# Patient Record
Sex: Female | Born: 1935
Health system: Southern US, Community
[De-identification: ages and names within clinical notes are randomized; demographics above are authoritative.]

## PROBLEM LIST (undated history)

## (undated) DIAGNOSIS — I10 Essential (primary) hypertension: Secondary | ICD-10-CM

## (undated) DIAGNOSIS — E785 Hyperlipidemia, unspecified: Secondary | ICD-10-CM

## (undated) HISTORY — DX: Essential (primary) hypertension: I10

## (undated) HISTORY — DX: Hyperlipidemia, unspecified: E78.5

---

## 2001-11-16 ENCOUNTER — Other Ambulatory Visit: Admission: RE | Admit: 2001-11-16 | Discharge: 2001-11-16 | Payer: Self-pay | Admitting: Family Medicine

## 2001-11-29 ENCOUNTER — Encounter: Payer: Self-pay | Admitting: Family Medicine

## 2001-11-29 ENCOUNTER — Encounter: Admission: RE | Admit: 2001-11-29 | Discharge: 2001-11-29 | Payer: Self-pay | Admitting: Family Medicine

## 2002-03-26 ENCOUNTER — Ambulatory Visit (HOSPITAL_COMMUNITY): Admission: RE | Admit: 2002-03-26 | Discharge: 2002-03-26 | Payer: Self-pay | Admitting: Gastroenterology

## 2002-03-26 ENCOUNTER — Encounter (INDEPENDENT_AMBULATORY_CARE_PROVIDER_SITE_OTHER): Payer: Self-pay | Admitting: *Deleted

## 2003-04-22 ENCOUNTER — Ambulatory Visit (HOSPITAL_COMMUNITY): Admission: RE | Admit: 2003-04-22 | Discharge: 2003-04-22 | Payer: Self-pay | Admitting: Gastroenterology

## 2003-04-22 ENCOUNTER — Encounter (INDEPENDENT_AMBULATORY_CARE_PROVIDER_SITE_OTHER): Payer: Self-pay | Admitting: *Deleted

## 2007-05-09 ENCOUNTER — Encounter: Admission: RE | Admit: 2007-05-09 | Discharge: 2007-05-09 | Payer: Self-pay | Admitting: Family Medicine

## 2007-05-11 ENCOUNTER — Encounter: Admission: RE | Admit: 2007-05-11 | Discharge: 2007-05-11 | Payer: Self-pay | Admitting: Family Medicine

## 2007-05-11 ENCOUNTER — Encounter: Payer: Self-pay | Admitting: Pulmonary Disease

## 2008-02-05 ENCOUNTER — Encounter: Payer: Self-pay | Admitting: Pulmonary Disease

## 2008-02-25 ENCOUNTER — Ambulatory Visit: Payer: Self-pay | Admitting: Pulmonary Disease

## 2008-02-25 DIAGNOSIS — R0602 Shortness of breath: Secondary | ICD-10-CM | POA: Insufficient documentation

## 2008-02-25 DIAGNOSIS — I10 Essential (primary) hypertension: Secondary | ICD-10-CM | POA: Insufficient documentation

## 2008-02-25 DIAGNOSIS — E785 Hyperlipidemia, unspecified: Secondary | ICD-10-CM | POA: Insufficient documentation

## 2008-02-25 DIAGNOSIS — J309 Allergic rhinitis, unspecified: Secondary | ICD-10-CM | POA: Insufficient documentation

## 2008-03-13 ENCOUNTER — Ambulatory Visit: Payer: Self-pay | Admitting: Pulmonary Disease

## 2008-03-18 ENCOUNTER — Telehealth (INDEPENDENT_AMBULATORY_CARE_PROVIDER_SITE_OTHER): Payer: Self-pay | Admitting: *Deleted

## 2008-03-21 ENCOUNTER — Ambulatory Visit: Payer: Self-pay | Admitting: Pulmonary Disease

## 2008-09-12 HISTORY — PX: LUMBAR DISC SURGERY: SHX700

## 2008-12-17 ENCOUNTER — Encounter: Admission: RE | Admit: 2008-12-17 | Discharge: 2008-12-17 | Payer: Self-pay | Admitting: Family Medicine

## 2008-12-23 ENCOUNTER — Ambulatory Visit (HOSPITAL_COMMUNITY): Admission: RE | Admit: 2008-12-23 | Discharge: 2008-12-23 | Payer: Self-pay | Admitting: Neurosurgery

## 2010-01-13 ENCOUNTER — Encounter: Admission: RE | Admit: 2010-01-13 | Discharge: 2010-01-13 | Payer: Self-pay | Admitting: Family Medicine

## 2010-10-21 ENCOUNTER — Encounter (INDEPENDENT_AMBULATORY_CARE_PROVIDER_SITE_OTHER): Payer: Medicare Other | Admitting: Internal Medicine

## 2010-10-21 DIAGNOSIS — E785 Hyperlipidemia, unspecified: Secondary | ICD-10-CM

## 2010-10-21 DIAGNOSIS — I10 Essential (primary) hypertension: Secondary | ICD-10-CM

## 2010-12-22 LAB — CBC
HCT: 44.2 % (ref 36.0–46.0)
Hemoglobin: 15.4 g/dL — ABNORMAL HIGH (ref 12.0–15.0)
MCV: 89.9 fL (ref 78.0–100.0)
Platelets: 341 10*3/uL (ref 150–400)
RDW: 13.2 % (ref 11.5–15.5)

## 2010-12-22 LAB — BASIC METABOLIC PANEL
BUN: 18 mg/dL (ref 6–23)
CO2: 30 mEq/L (ref 19–32)
Chloride: 94 mEq/L — ABNORMAL LOW (ref 96–112)
Glucose, Bld: 81 mg/dL (ref 70–99)
Potassium: 3.9 mEq/L (ref 3.5–5.1)
Sodium: 132 mEq/L — ABNORMAL LOW (ref 135–145)

## 2011-01-25 NOTE — Op Note (Signed)
Veronica Mcgrath, Veronica Mcgrath             ACCOUNT NO.:  [id number]   MEDICAL RECORD NO.:  [id number]          PATIENT TYPE:  OIB   LOCATION:  3538                         FACILITY:  [facility]   PHYSICIAN:  Reinaldo Meeker, M.D. DATE OF BIRTH:  12-13-1934   DATE OF PROCEDURE:  12/23/2008  DATE OF DISCHARGE:  12/23/2008                               OPERATIVE REPORT   PREOPERATIVE DIAGNOSIS:  Herniated disk at L4-5, left.   POSTOPERATIVE DIAGNOSIS:  Herniated disk at L4-5, left.   PROCEDURE:  Left L4-5 interlaminar laminotomy for excision of herniated  disk with operating microscope.   SECONDARY PROCEDURE:  Microdissection at L4-5 disk and L5 nerve root.   SURGEON:  Reinaldo Meeker, MD   ASSISTANT:  Tia Alert, MD   PROCEDURE IN DETAIL:  After being placed in the prone position, the  patient's back was prepped and draped in the usual sterile fashion.  Localizing x-ray was taken prior to incision to identify the appropriate  level.  Midline incision was made above the spinous processes of L4 and  L5.  Using Bovie cutting current, the incision was carried down the  spinous processes.  Subperiosteal dissection was then carried out on the  left sided of the spinous processes and lamina.  Self-retaining  retractor was placed for exposure.  X-ray showed approach to the  appropriate level.  Using a high-speed drill, the inferior one third of  the L4 lamina, the inferior one third of the central, and the superior  one half of the L5 lamina were removed.  Residual bone and ligamentum  flavum were removed in a piecemeal fashion.  The microscope was draped,  brought in the field, used remainder of the case.  Using microdissection  technique, the lateral aspect of the thecal sac and L5 nerve were  identified.  Further coagulation was carried out down on the further  canal to identify the L4-5 disk, which was found remarkably herniated.  After coagulating on the annulas, fascia was incised with a  15-blade.  Using pituitary rongeurs and curettes, the disk space clean-out was  carried out.  At the same time, great care was taken avoid injury to the  neural elements.  This was successfully done.  Large free fragment disk  material was removed beneath the nerve and it was gave excellent  decompression of the thecal sac and L5 nerve root.  At this time,  inspection was carried out in all directions without any evidence of  residual compression and none could be identified.  Large amounts of  irrigation was carried out.  Any bleeding controlled with bipolar  coagulation and Gelfoam.  The wound was then closed in multiple layers  of Vicryl and the muscle.  Fascia, subcutaneous and subcuticular tissues  and staples were placed on the skin.  Sterile dressing was then applied  and the patient was extubated, taken to the recovery room in stable  condition.           ______________________________  Reinaldo Meeker, M.D.     ROK/MEDQ  D:  12/23/2008  T:  12/23/2008  Job:  [id number] 

## 2011-01-28 NOTE — Procedures (Signed)
[facility]  Patient:    Veronica Mcgrath, Veronica Mcgrath Visit Number: [id number] MRN: [id number]          Service Type: END Location: ENDO Attending Physician:  Nelda Marseille Dictated by:   Petra Kuba, M.D. Proc. Date: 03/26/02 Admit Date:  03/26/2002 Discharge Date: 03/26/2002   CC:         Talmadge Coventry, M.D.   Procedure Report  PROCEDURE:  Colonoscopy with polypectomy.  INDICATIONS FOR PROCEDURE:  Family history of colon cancer due for colonic screening.  Consent was signed after risks, benefits, methods, and options were thoroughly discussed in the office.  MEDICINES USED:  Demerol 90, Versed 8.5.  DESCRIPTION OF PROCEDURE:  Rectal inspection was pertinent for external hemorrhoids, small. Digital exam was negative. The video pediatric adjustable colonoscope was inserted and despite a tortuous colon was able to be advanced to the cecum. This did require rolling her on her back and some abdominal pressure. On insertion, two sigmoid polyps were seen as well as a large proximal transverse polyp. The cecum was identified by the appendiceal orifice and the ileocecal valve. The prep was adequate. There was some liquid stool that required washing and suctioning. On slow withdrawal in the more proximal ascending colon, a small polyp was seen, snared, electrocautery applied. The polyp was suctioned through the scope and collected in the trap. There was a minimal amount of residual bleeding which was hot biopsied x2 with good cessation of bleeding. The scope was slowly withdrawn. In the more distal ascending, a small polyp was seen and was hot biopsied x1 and then in the more proximal transverse another small polyp was seen, snared, electrocautery applied and this was suctioned through the scope and collected in the trap. One other small transverse polyp was seen and was hot biopsied. Those were all put in the first container. We then withdrew back  to the large pedunculated polyp which was snared, electrocautery applied and the polyp was removed. We went ahead and grabbed that with the basket four prong and slowly withdrew the scope with the basket in the distance getting some visualization of the colon. The two sigmoid polyps seen on insertion were seen but no other lesions. This polyp was recovered, the scope was inserted and retroflexed pertinent for some internal hemorrhoids. The scope was then advanced and the two sigmoid polyps were seen, snared, electrocautery applied. One was suctioned through the scope and collected in the trap. The other was suctioned onto the head of the scope and then the polyp recovered. Both were put in the third container. The scope was then reinserted to the level of the large polypectomy and slowly withdrew. On the left side of the colon, approximately 4 tiny to small probable hyperplastic appearing polyps were seen and were all hot biopsied and put in the third container. There were some left sided diverticula seen but no other abnormalities. Once back in the rectum, one small polyp was seen there and not biopsied as well. We went ahead and readvanced a short ways up the sigmoid, air was suctioned, scope removed. The patient tolerated the procedure well and there was no obvious or immediate complication.  ENDOSCOPIC DIAGNOSIS: 1. Internal and external hemorrhoids. 2. Left occasional diverticula. 3. Multiple left sided small to medium sized polyps with three snares and    multiple hot biopsies put in the third container. 4. Large pedunculated transverse proximal polyp snared, removed with the    basket with the second container. 5. Four  ascending and transverse polyps, two hot biopsied and two snared    put in the first container. 6. Otherwise within normal limits to the cecum.  PLAN:  Await pathology but based on the numbers and sizes probably recheck in one year. Happy to see back p.r.n.  otherwise return care to Dr. Smith Mince for the customary health care maintenance to include yearly rectals and guaiacs and will put her on the customary two week post polypectomy instructions. Dictated by:   Petra Kuba, M.D. Attending Physician:  Nelda Marseille DD:  03/26/02 TD:  03/29/02 Job: [id number]

## 2011-01-28 NOTE — Op Note (Signed)
NAME:  Veronica Mcgrath, Veronica Mcgrath                       ACCOUNT NO.:  [id number]   MEDICAL RECORD NO.:  [id number]                   PATIENT TYPE:  AMB   LOCATION:  ENDO                                 FACILITY:  [facility]   PHYSICIAN:  Petra Kuba, M.D.                 DATE OF BIRTH:  1934/11/15   DATE OF PROCEDURE:  04/22/2003  DATE OF DISCHARGE:                                 OPERATIVE REPORT   PROCEDURE:  Colonoscopy with hot biopsy.   INDICATIONS:  Patient with a history of colon polyps, due for repeat  screening.  Consent was signed after risks, benefits, methods, and options  thoroughly discussed multiple times in the past.   MEDICINES USED:  Demerol 70 mg, Versed 7 mg.   DESCRIPTION OF PROCEDURE:  Rectal inspection was pertinent for external  hemorrhoids, small.  Digital exam was negative.  The video pediatric  adjustable colonoscope was inserted and easier than last time advanced  around the colon to the cecum.  This did require some abdominal pressure but  no position changes.  No obvious abnormality was seen on insertion.  The  cecum was identified by the appendiceal orifice and the ileocecal valve, and  the scope was slowly withdrawn.  The prep on the right side was fairly  adequate, much better on the transverse and left.  With washing and  suctioning, adequate visualization was obtained.  On slow withdrawal through  the colon, the cecum and the ascending were normal.  There were a few tiny  polyps in the transverse and the descending, which were hot biopsied and put  in the first container.  There was a rare left-sided diverticulum seen.  As  the scope was withdrawn around the distal sigmoid and rectum, multiple tiny  hyperplastic-appearing polyps were seen, which were hot biopsied and put in  a second container.  Anorectal pull-through and retroflexion back in the  rectum confirmed some hemorrhoids, internal greater than external.  The  scope was inserted a short way up the  left side of the colon, air was  suctioned, the scope was removed.  The patient tolerated the procedure well.  There was no obvious immediate complication.   ENDOSCOPIC DIAGNOSES:  1. Internal-external hemorrhoids.  2. Rare left-sided diverticula.  3. A few hyperplastic-appearing rectal and distal sigmoid polyps, hot     biopsied.  4. Two tiny transverse and descending polyps, hot biopsied.  5. Otherwise within normal limits to the cecum.    PLAN:  Await pathology to determine future colonic screening.  Even if these  are all hyperplastic, based on her significant polyps in the past, probably  repeat in three years.  Happy to see back p.r.n., otherwise yearly rectals  and guaiacs and other health care maintenance per Dr. Smith Mince.  Petra Kuba, M.D.    MEM/MEDQ  D:  04/22/2003  T:  04/22/2003  Job:  [id number]   cc:   Talmadge Coventry, M.D.  [address]  [place]  [place] [address]  Fax: [phone]

## 2011-01-31 ENCOUNTER — Other Ambulatory Visit: Payer: Self-pay | Admitting: *Deleted

## 2011-01-31 MED ORDER — BENAZEPRIL HCL 20 MG PO TABS: 20.0000 mg | ORAL_TABLET | Freq: Every day | ORAL | Status: DC

## 2011-02-14 ENCOUNTER — Other Ambulatory Visit: Payer: Self-pay | Admitting: *Deleted

## 2011-02-14 DIAGNOSIS — E785 Hyperlipidemia, unspecified: Secondary | ICD-10-CM

## 2011-02-14 MED ORDER — SIMVASTATIN 20 MG PO TABS: 20.0000 mg | ORAL_TABLET | Freq: Every evening | ORAL | Status: DC

## 2011-04-18 ENCOUNTER — Encounter: Payer: Self-pay | Admitting: Internal Medicine

## 2011-04-19 ENCOUNTER — Other Ambulatory Visit: Payer: Medicare Other | Admitting: Internal Medicine

## 2011-04-19 DIAGNOSIS — E785 Hyperlipidemia, unspecified: Secondary | ICD-10-CM

## 2011-04-19 LAB — LIPID PANEL: LDL Cholesterol: 101 mg/dL — ABNORMAL HIGH (ref 0–99)

## 2011-04-20 LAB — HEPATIC FUNCTION PANEL
AST: 14 U/L (ref 0–37)
Albumin: 4.3 g/dL (ref 3.5–5.2)
Alkaline Phosphatase: 44 U/L (ref 39–117)
Bilirubin, Direct: 0.1 mg/dL (ref 0.0–0.3)
Total Bilirubin: 0.6 mg/dL (ref 0.3–1.2)

## 2011-04-21 ENCOUNTER — Telehealth: Payer: Self-pay | Admitting: Internal Medicine

## 2011-04-21 ENCOUNTER — Ambulatory Visit: Payer: BLUE CROSS/BLUE SHIELD | Admitting: Internal Medicine

## 2011-04-21 NOTE — Telephone Encounter (Signed)
Called patient Wednesday, 04/20/11, and left message that she needed to schedule an appt with Dr. Lenord Fellers.

## 2011-04-26 ENCOUNTER — Ambulatory Visit (INDEPENDENT_AMBULATORY_CARE_PROVIDER_SITE_OTHER): Payer: Medicare Other | Admitting: Internal Medicine

## 2011-04-26 ENCOUNTER — Encounter: Payer: Self-pay | Admitting: Internal Medicine

## 2011-04-26 DIAGNOSIS — E785 Hyperlipidemia, unspecified: Secondary | ICD-10-CM

## 2011-04-26 DIAGNOSIS — I10 Essential (primary) hypertension: Secondary | ICD-10-CM

## 2011-04-26 DIAGNOSIS — H409 Unspecified glaucoma: Secondary | ICD-10-CM

## 2011-06-05 DIAGNOSIS — H409 Unspecified glaucoma: Secondary | ICD-10-CM | POA: Insufficient documentation

## 2011-06-05 NOTE — Progress Notes (Signed)
  Subjective:    Patient ID: Veronica Mcgrath, female    DOB: 06-20-1935, 76 y.o.   MRN: [id number]  HPI patient last year February 2012 for annual exam and evaluation of medical problems. This is her six-month recheck.. History of hypertension hyperlipidemia and glaucoma. Had cataract extraction both eyes around 2008. Lumbar spine surgery 2010. Family history of colon cancer. Patient had colonoscopy 2011. Had tetanus immunization 2003, influenza vaccine 2011, Pneumovax vaccine 2003. Currently taking Zocor for hyperlipidemia and HCTZ and benazepril for hypertension. No complaints or problems. Fasting lab work obtained and reviewed with her. In February she had stopped taking Zocor in total cholesterol was 246 with an LDL cholesterol of 150. She is now compliant with Zocor. Blood pressure is under good control on current regimen.    Review of Systems     Objective:   Physical Exam neck supple no carotid bruits no thyromegaly; chest clear; cardiac exam regular rate and rhythm; extremities without edema        Assessment & Plan:  Hypertension  Hyperlipidemia  History of glaucoma  Plan is to return in 6 months for physical examination. She is to get an annual influenza immunization

## 2011-06-16 ENCOUNTER — Other Ambulatory Visit: Payer: Self-pay | Admitting: *Deleted

## 2011-06-16 DIAGNOSIS — E785 Hyperlipidemia, unspecified: Secondary | ICD-10-CM

## 2011-06-16 MED ORDER — SIMVASTATIN 20 MG PO TABS: 20.0000 mg | ORAL_TABLET | Freq: Every evening | ORAL | Status: DC

## 2011-07-04 ENCOUNTER — Other Ambulatory Visit: Payer: Self-pay | Admitting: Internal Medicine

## 2011-07-28 ENCOUNTER — Ambulatory Visit (INDEPENDENT_AMBULATORY_CARE_PROVIDER_SITE_OTHER): Payer: Medicare Other | Admitting: Internal Medicine

## 2011-07-28 ENCOUNTER — Encounter: Payer: Self-pay | Admitting: Internal Medicine

## 2011-07-28 VITALS — BP 112/72 | HR 76 | Temp 97.8°F | Ht 68.0 in | Wt 159.0 lb

## 2011-07-28 DIAGNOSIS — I10 Essential (primary) hypertension: Secondary | ICD-10-CM

## 2011-07-28 DIAGNOSIS — H409 Unspecified glaucoma: Secondary | ICD-10-CM

## 2011-07-28 DIAGNOSIS — Z23 Encounter for immunization: Secondary | ICD-10-CM

## 2011-07-28 DIAGNOSIS — E785 Hyperlipidemia, unspecified: Secondary | ICD-10-CM

## 2011-07-28 DIAGNOSIS — J309 Allergic rhinitis, unspecified: Secondary | ICD-10-CM

## 2011-07-28 LAB — BASIC METABOLIC PANEL
BUN: 12 mg/dL (ref 6–23)
Calcium: 9.8 mg/dL (ref 8.4–10.5)
Glucose, Bld: 90 mg/dL (ref 70–99)
Potassium: 4.6 mEq/L (ref 3.5–5.3)
Sodium: 139 mEq/L (ref 135–145)

## 2011-07-28 LAB — LIPID PANEL
Cholesterol: 199 mg/dL (ref 0–200)
HDL: 86 mg/dL (ref >39)
LDL Cholesterol: 97 mg/dL (ref 0–99)
Total CHOL/HDL Ratio: 2.3 Ratio
Triglycerides: 78 mg/dL (ref <150)
VLDL: 16 mg/dL (ref 0–40)

## 2011-08-15 NOTE — Patient Instructions (Signed)
Continue same medications. Watch diet and try to exercise. Return in 6 months for physical exam. Immunization given today for influenza

## 2011-08-15 NOTE — Progress Notes (Signed)
  Subjective:    Patient ID: Veronica Mcgrath, female    DOB: 09/21/1934, 76 y.o.   MRN: [id number]  HPI 77 year old white female with history of hypertension hyperlipidemia allergic rhinitis and glaucoma for six-month recheck. Is on Lotensin, HCTZ and Zocor. No complaints or problems. Feels well. Influenza immunization given today.    Review of Systems     Objective:   Physical Exam neck supple without thyromegaly JVD or carotid bruits; chest clear; cardiac exam regular rate and rhythm; extremities without edema.        Assessment & Plan:  Hypertension  Hyperlipidemia  Glaucoma  Allergic rhinitis  Plan: Review fasting lipid panel and liver functions. Influenza immunization administered today. Return in 6 months for physical exam.

## 2011-09-26 DIAGNOSIS — H4011X0 Primary open-angle glaucoma, stage unspecified: Secondary | ICD-10-CM | POA: Diagnosis not present

## 2011-10-04 ENCOUNTER — Other Ambulatory Visit: Payer: Self-pay | Admitting: Internal Medicine

## 2011-11-01 ENCOUNTER — Other Ambulatory Visit: Payer: Medicare Other | Admitting: Internal Medicine

## 2011-11-01 DIAGNOSIS — I10 Essential (primary) hypertension: Secondary | ICD-10-CM

## 2011-11-01 DIAGNOSIS — E559 Vitamin D deficiency, unspecified: Secondary | ICD-10-CM | POA: Diagnosis not present

## 2011-11-01 DIAGNOSIS — E785 Hyperlipidemia, unspecified: Secondary | ICD-10-CM

## 2011-11-01 LAB — CBC WITH DIFFERENTIAL/PLATELET
Basophils Absolute: 0.1 10*3/uL (ref 0.0–0.1)
Lymphocytes Relative: 33 % (ref 12–46)
Lymphs Abs: 1.8 10*3/uL (ref 0.7–4.0)
MCV: 91.5 fL (ref 78.0–100.0)
Neutro Abs: 2.8 10*3/uL (ref 1.7–7.7)
Platelets: 276 10*3/uL (ref 150–400)
RBC: 4.92 MIL/uL (ref 3.87–5.11)
RDW: 13.5 % (ref 11.5–15.5)
WBC: 5.5 10*3/uL (ref 4.0–10.5)

## 2011-11-01 LAB — COMPREHENSIVE METABOLIC PANEL
ALT: 15 U/L (ref 0–35)
AST: 18 U/L (ref 0–37)
Albumin: 4.4 g/dL (ref 3.5–5.2)
CO2: 30 mEq/L (ref 19–32)
Calcium: 9.7 mg/dL (ref 8.4–10.5)
Chloride: 98 mEq/L (ref 96–112)
Potassium: 3.7 mEq/L (ref 3.5–5.3)
Sodium: 136 mEq/L (ref 135–145)
Total Protein: 6.7 g/dL (ref 6.0–8.3)

## 2011-11-01 LAB — LIPID PANEL
LDL Cholesterol: 117 mg/dL — ABNORMAL HIGH (ref 0–99)
Triglycerides: 124 mg/dL (ref <150)
VLDL: 25 mg/dL (ref 0–40)

## 2011-11-01 LAB — TSH: TSH: 3.8 u[IU]/mL (ref 0.350–4.500)

## 2011-11-03 ENCOUNTER — Encounter: Payer: Medicare Other | Admitting: Internal Medicine

## 2012-01-24 ENCOUNTER — Other Ambulatory Visit: Payer: Medicare Other | Admitting: Internal Medicine

## 2012-01-24 ENCOUNTER — Ambulatory Visit (INDEPENDENT_AMBULATORY_CARE_PROVIDER_SITE_OTHER): Payer: Medicare Other | Admitting: Internal Medicine

## 2012-01-24 ENCOUNTER — Encounter: Payer: Self-pay | Admitting: Internal Medicine

## 2012-01-24 VITALS — BP 126/80 | HR 76 | Temp 97.6°F | Ht 67.5 in | Wt 160.0 lb

## 2012-01-24 DIAGNOSIS — I10 Essential (primary) hypertension: Secondary | ICD-10-CM

## 2012-01-24 DIAGNOSIS — Z0000 Encounter for general adult medical examination without abnormal findings: Secondary | ICD-10-CM | POA: Diagnosis not present

## 2012-01-24 DIAGNOSIS — Z23 Encounter for immunization: Secondary | ICD-10-CM | POA: Diagnosis not present

## 2012-01-24 DIAGNOSIS — E785 Hyperlipidemia, unspecified: Secondary | ICD-10-CM

## 2012-01-24 DIAGNOSIS — Z79899 Other long term (current) drug therapy: Secondary | ICD-10-CM | POA: Diagnosis not present

## 2012-01-24 LAB — LIPID PANEL
Cholesterol: 179 mg/dL (ref 0–200)
HDL: 81 mg/dL (ref >39)
Triglycerides: 95 mg/dL (ref <150)

## 2012-01-24 LAB — HEPATIC FUNCTION PANEL
ALT: 15 U/L (ref 0–35)
AST: 17 U/L (ref 0–37)
Albumin: 4.5 g/dL (ref 3.5–5.2)
Alkaline Phosphatase: 46 U/L (ref 39–117)
Total Bilirubin: 0.6 mg/dL (ref 0.3–1.2)

## 2012-01-24 MED ORDER — BENAZEPRIL HCL 20 MG PO TABS: 20.0000 mg | ORAL_TABLET | Freq: Every day | ORAL | Status: DC

## 2012-01-25 DIAGNOSIS — Z961 Presence of intraocular lens: Secondary | ICD-10-CM | POA: Diagnosis not present

## 2012-01-25 DIAGNOSIS — H2640 Unspecified secondary cataract: Secondary | ICD-10-CM | POA: Diagnosis not present

## 2012-01-25 DIAGNOSIS — H4011X0 Primary open-angle glaucoma, stage unspecified: Secondary | ICD-10-CM | POA: Diagnosis not present

## 2012-01-25 DIAGNOSIS — H409 Unspecified glaucoma: Secondary | ICD-10-CM | POA: Diagnosis not present

## 2012-02-23 ENCOUNTER — Telehealth: Payer: Self-pay | Admitting: Internal Medicine

## 2012-02-23 DIAGNOSIS — E785 Hyperlipidemia, unspecified: Secondary | ICD-10-CM

## 2012-02-23 MED ORDER — SIMVASTATIN 20 MG PO TABS: 20.0000 mg | ORAL_TABLET | Freq: Every evening | ORAL | Status: DC

## 2012-02-23 NOTE — Telephone Encounter (Signed)
Please refill x 6 months to new pharmacy.

## 2012-03-12 ENCOUNTER — Encounter: Payer: Self-pay | Admitting: Internal Medicine

## 2012-03-12 NOTE — Progress Notes (Signed)
Subjective:    Patient ID: Veronica Mcgrath, female    DOB: 04/07/1935, 77 y.o.   MRN: [id number]  HPI 77 year old female with history of essential hypertension, hyperlipidemia, allergic rhinitis, glaucoma. Patient has family history of colon cancer. A cataract extraction both eyes proximally 2008. Surgery a lumbar spine 2010. Was placed on Zocor in 2011 but stopped taking it for several months thinking it had significant side effects. Medications include HCTZ, benazepril, Zocor, trazatan ophthalmic drops.  Patient is retired and is a widow. Does not smoke. Social alcohol consumption consisting of wine but not every day has a 4 year college education.  Family history: Father died of colon cancer at age 64. Mother died of a stroke with history of diabetes mellitus at age 84. One sister in good health. 3 adult sons.  Patient had Pneumovax immunization 2003, gets annual influenza immunization. Had colonoscopy 2011. Tetanus immunization 2003.  Subjective:   Patient presents for Medicare Annual/Subsequent preventive examination.   Review Past Medical/Family/Social:   Risk Factors  Current exercise habits: Walks for exercise Dietary issues discussed: Review low-fat diet  Cardiac risk factors:  Depression Screen  (Note: if answer to either of the following is "Yes", a more complete depression screening is indicated)   Over the past two weeks, have you felt down, depressed or hopeless? No  Over the past two weeks, have you felt little interest or pleasure in doing things? No Have you lost interest or pleasure in daily life? No Do you often feel hopeless? No Do you cry easily over simple problems? No   Activities of Daily Living  In your present state of health, do you have any difficulty performing the following activities?:   Driving? No  Managing money? No  Feeding yourself? No  Getting from bed to chair? No  Climbing a flight of stairs? No  Preparing food and eating?: No   Bathing or showering? No  Getting dressed: No  Getting to the toilet? No  Using the toilet:No  Moving around from place to place: No  In the past year have you fallen or had a near fall?:No  Are you sexually active? No  Do you have more than one partner? No   Hearing Difficulties: No  Do you often ask people to speak up or repeat themselves? No  Do you experience ringing or noises in your ears? No  Do you have difficulty understanding soft or whispered voices? No  Do you feel that you have a problem with memory? No Do you often misplace items? No    Home Safety:  Do you have a smoke alarm at your residence? Yes Do you have grab bars in the bathroom?no Do you have throw rugs in your house?yes   Cognitive Testing  Alert? Yes Normal Appearance?Yes  Oriented to person? Yes Place? Yes  Time? Yes  Recall of three objects? Yes  Can perform simple calculations? Yes  Displays appropriate judgment?Yes  Can read the correct time from a watch face?Yes   List the Names of Other Physician/Practitioners you currently use:  See referral list for the physicians patient is currently seeing.   Ophthalmologist  Review of Systems:   Objective:     General appearance: Appears stated age and mildly obese  Head: Normocephalic, without obvious abnormality, atraumatic  Eyes: conj clear, EOMi PEERLA  Ears: normal TM's and external ear canals both ears  Nose: Nares normal. Septum midline. Mucosa normal. No drainage or sinus tenderness.  Throat: lips, mucosa, and tongue  normal; teeth and gums normal  Neck: no adenopathy, no carotid bruit, no JVD, supple, symmetrical, trachea midline and thyroid not enlarged, symmetric, no tenderness/mass/nodules  No CVA tenderness.  Lungs: clear to auscultation bilaterally  Breasts: normal appearance, no masses or tenderness, top of the pacemaker on left upper chest. Incision well-healed. It is tender.  Heart: regular rate and rhythm, S1, S2 normal, no  murmur, click, rub or gallop  Abdomen: soft, non-tender; bowel sounds normal; no masses, no organomegaly  Musculoskeletal: ROM normal in all joints, no crepitus, no deformity, Normal muscle strengthen. Back  is symmetric, no curvature. Skin: Skin color, texture, turgor normal. No rashes or lesions  Lymph nodes: Cervical, supraclavicular, and axillary nodes normal.  Neurologic: CN 2 -12 Normal, Normal symmetric reflexes. Normal coordination and gait  Psych: Alert & Oriented x 3, Mood appear stable.    Assessment:    Annual wellness medicare exam   Plan:    During the course of the visit the patient was educated and counseled about appropriate screening and preventive services including:        Patient Instructions (the written plan) was given to the patient.  Medicare Attestation  I have personally reviewed:  The patient's medical and social history  Their use of alcohol, tobacco or illicit drugs  Their current medications and supplements  The patient's functional ability including ADLs,fall risks, home safety risks, cognitive, and hearing and visual impairment  Diet and physical activities  Evidence for depression or mood disorders  The patient's weight, height, BMI, and visual acuity have been recorded in the chart. I have made referrals, counseling, and provided education to the patient based on review of the above and I have provided the patient with a written personalized care plan for preventive services.        Review of Systems  Constitutional: Negative.   HENT: Negative.   Respiratory: Negative.   Cardiovascular: Negative.   Gastrointestinal: Negative.   Genitourinary: Negative.   Musculoskeletal: Negative.   Neurological: Negative.   Hematological: Negative.   Psychiatric/Behavioral: Negative.        Objective:   Physical Exam  Nursing note and vitals reviewed. Constitutional: She is oriented to person, place, and time. She appears well-developed and  well-nourished. No distress.  HENT:  Head: Normocephalic.  Right Ear: External ear normal.  Left Ear: External ear normal.  Mouth/Throat: Oropharynx is clear and moist. No oropharyngeal exudate.  Eyes: Conjunctivae and EOM are normal. Pupils are equal, round, and reactive to light. Right eye exhibits no discharge. Left eye exhibits no discharge. No scleral icterus.  Neck: Neck supple. No JVD present. No thyromegaly present.  Cardiovascular: Normal rate, regular rhythm, normal heart sounds and intact distal pulses.   No murmur heard. Pulmonary/Chest: Effort normal and breath sounds normal. She has no wheezes. She has no rales.  Abdominal: Soft. Bowel sounds are normal. She exhibits no distension and no mass. There is no rebound and no guarding.  Genitourinary:       Deferred  Musculoskeletal: She exhibits no edema.  Lymphadenopathy:    She has no cervical adenopathy.  Neurological: She is alert and oriented to person, place, and time. She has normal reflexes. She displays normal reflexes. No cranial nerve deficit. Coordination normal.  Skin: Skin is warm and dry. No rash noted. She is not diaphoretic.  Psychiatric: She has a normal mood and affect. Her behavior is normal. Judgment and thought content normal.          Assessment &  Plan:  Hyperlipidemia  History of glaucoma  Hypertension  Family history of colon cancer  Plan: Continue same medications and return in 6 months. Fasting labs are drawn including CBC with differential, TSH, vitamin D, fasting lipid panel, complete metabolic panel. Immunizations are up-to-date. Prescription given for shingles vaccine. Colonoscopy is up-to-date. Tdap update given today.

## 2012-06-05 DIAGNOSIS — H4011X0 Primary open-angle glaucoma, stage unspecified: Secondary | ICD-10-CM | POA: Diagnosis not present

## 2012-06-05 DIAGNOSIS — H409 Unspecified glaucoma: Secondary | ICD-10-CM | POA: Diagnosis not present

## 2012-06-05 DIAGNOSIS — H2640 Unspecified secondary cataract: Secondary | ICD-10-CM | POA: Diagnosis not present

## 2012-07-24 ENCOUNTER — Other Ambulatory Visit: Payer: Medicare Other | Admitting: Internal Medicine

## 2012-07-24 DIAGNOSIS — D3130 Benign neoplasm of unspecified choroid: Secondary | ICD-10-CM | POA: Diagnosis not present

## 2012-07-24 DIAGNOSIS — Z79899 Other long term (current) drug therapy: Secondary | ICD-10-CM

## 2012-07-24 DIAGNOSIS — H353190 Nonexudative age-related macular degeneration, unspecified eye, stage unspecified: Secondary | ICD-10-CM | POA: Diagnosis not present

## 2012-07-24 DIAGNOSIS — E785 Hyperlipidemia, unspecified: Secondary | ICD-10-CM

## 2012-07-24 LAB — HEPATIC FUNCTION PANEL
AST: 18 U/L (ref 0–37)
Bilirubin, Direct: 0.2 mg/dL (ref 0.0–0.3)
Indirect Bilirubin: 0.6 mg/dL (ref 0.0–0.9)
Total Bilirubin: 0.8 mg/dL (ref 0.3–1.2)

## 2012-07-24 LAB — LIPID PANEL
Total CHOL/HDL Ratio: 2.4 Ratio
VLDL: 19 mg/dL (ref 0–40)

## 2012-07-27 ENCOUNTER — Encounter: Payer: Self-pay | Admitting: Internal Medicine

## 2012-07-27 ENCOUNTER — Ambulatory Visit (INDEPENDENT_AMBULATORY_CARE_PROVIDER_SITE_OTHER): Payer: Medicare Other | Admitting: Internal Medicine

## 2012-07-27 VITALS — BP 122/76 | HR 76 | Temp 99.0°F | Wt 158.0 lb

## 2012-07-27 DIAGNOSIS — E785 Hyperlipidemia, unspecified: Secondary | ICD-10-CM

## 2012-07-27 DIAGNOSIS — I10 Essential (primary) hypertension: Secondary | ICD-10-CM | POA: Diagnosis not present

## 2012-07-27 DIAGNOSIS — Z23 Encounter for immunization: Secondary | ICD-10-CM | POA: Diagnosis not present

## 2012-07-27 NOTE — Patient Instructions (Addendum)
Continue same medications and return in 6 months for physical exam. Influenza immunization given today.

## 2012-07-27 NOTE — Progress Notes (Signed)
  Subjective:    Patient ID: Veronica Mcgrath, female    DOB: 20-Dec-1934, 77 y.o.   MRN: [id number]  HPI 77 year old white female in today for six-month recheck on hypertension and hyperlipidemia. She is on statin therapy and I am pleased with her lipid results which were reviewed with her today. Blood pressure under excellent control on current regimen. No complaints or problems. Influenza immunization given today. She had Pneumovax immunization at age 77. Was given today a prescription to obtain Zostavax vaccine at pharmacy. She has had zoster in the remote past. Had colonoscopy in November 2011 by Dr. Ewing Schlein with followup in 5 years.    Review of Systems     Objective:   Physical Exam Chest clear to auscultation; cardiac exam regular rate and rhythm; extremities without edema; alert and oriented x3; skin is warm and dry.   Hyperlipidemia       Assessment & Plan:  Hypertension-well-controlled on current regimen Hyperlipidemia-well-controlled on current regimen Plan: Return in 6 months for physical exam and fasting lab studies. Influenza immunization given. Prescription given for Zostavax vaccine to be obtained at pharmacy

## 2012-10-08 ENCOUNTER — Other Ambulatory Visit: Payer: Self-pay | Admitting: Internal Medicine

## 2012-11-05 ENCOUNTER — Other Ambulatory Visit: Payer: Self-pay | Admitting: Internal Medicine

## 2012-11-05 NOTE — Telephone Encounter (Signed)
Refill through June 2014. Make sure pt has CPE booked for may or June - Last seen Nov 2013 for 6 moth recheck

## 2012-11-05 NOTE — Telephone Encounter (Signed)
Patient has an appointment in May, 2014

## 2012-12-03 DIAGNOSIS — H2640 Unspecified secondary cataract: Secondary | ICD-10-CM | POA: Diagnosis not present

## 2012-12-03 DIAGNOSIS — D3130 Benign neoplasm of unspecified choroid: Secondary | ICD-10-CM | POA: Diagnosis not present

## 2012-12-03 DIAGNOSIS — H409 Unspecified glaucoma: Secondary | ICD-10-CM | POA: Diagnosis not present

## 2012-12-03 DIAGNOSIS — H4011X0 Primary open-angle glaucoma, stage unspecified: Secondary | ICD-10-CM | POA: Diagnosis not present

## 2012-12-13 DIAGNOSIS — H2640 Unspecified secondary cataract: Secondary | ICD-10-CM | POA: Diagnosis not present

## 2012-12-13 DIAGNOSIS — H26499 Other secondary cataract, unspecified eye: Secondary | ICD-10-CM | POA: Diagnosis not present

## 2013-01-07 ENCOUNTER — Other Ambulatory Visit: Payer: Self-pay | Admitting: Internal Medicine

## 2013-01-28 ENCOUNTER — Other Ambulatory Visit: Payer: Medicare Other | Admitting: Internal Medicine

## 2013-01-28 DIAGNOSIS — Z13228 Encounter for screening for other metabolic disorders: Secondary | ICD-10-CM | POA: Diagnosis not present

## 2013-01-28 DIAGNOSIS — E785 Hyperlipidemia, unspecified: Secondary | ICD-10-CM | POA: Diagnosis not present

## 2013-01-28 DIAGNOSIS — Z0000 Encounter for general adult medical examination without abnormal findings: Secondary | ICD-10-CM

## 2013-01-28 DIAGNOSIS — Z1329 Encounter for screening for other suspected endocrine disorder: Secondary | ICD-10-CM | POA: Diagnosis not present

## 2013-01-28 DIAGNOSIS — Z130 Encounter for screening for diseases of the blood and blood-forming organs and certain disorders involving the immune mechanism: Secondary | ICD-10-CM | POA: Diagnosis not present

## 2013-01-28 DIAGNOSIS — Z131 Encounter for screening for diabetes mellitus: Secondary | ICD-10-CM | POA: Diagnosis not present

## 2013-01-28 DIAGNOSIS — M810 Age-related osteoporosis without current pathological fracture: Secondary | ICD-10-CM | POA: Diagnosis not present

## 2013-01-28 LAB — LIPID PANEL
Cholesterol: 193 mg/dL (ref 0–200)
HDL: 73 mg/dL (ref >39)
Total CHOL/HDL Ratio: 2.6 Ratio
Triglycerides: 81 mg/dL (ref <150)

## 2013-01-28 LAB — COMPREHENSIVE METABOLIC PANEL
BUN: 11 mg/dL (ref 6–23)
CO2: 30 mEq/L (ref 19–32)
Calcium: 9.8 mg/dL (ref 8.4–10.5)
Chloride: 97 mEq/L (ref 96–112)
Creat: 0.75 mg/dL (ref 0.50–1.10)
Glucose, Bld: 94 mg/dL (ref 70–99)

## 2013-01-28 LAB — CBC WITH DIFFERENTIAL/PLATELET
Eosinophils Relative: 2 % (ref 0–5)
HCT: 43 % (ref 36.0–46.0)
Lymphocytes Relative: 22 % (ref 12–46)
Lymphs Abs: 1.6 10*3/uL (ref 0.7–4.0)
MCV: 88.8 fL (ref 78.0–100.0)
Monocytes Absolute: 0.9 10*3/uL (ref 0.1–1.0)
RBC: 4.84 MIL/uL (ref 3.87–5.11)
WBC: 7.1 10*3/uL (ref 4.0–10.5)

## 2013-01-29 ENCOUNTER — Encounter: Payer: Medicare Other | Admitting: Internal Medicine

## 2013-01-29 LAB — VITAMIN D 25 HYDROXY (VIT D DEFICIENCY, FRACTURES): Vit D, 25-Hydroxy: 26 ng/mL — ABNORMAL LOW (ref 30–89)

## 2013-01-31 ENCOUNTER — Ambulatory Visit (INDEPENDENT_AMBULATORY_CARE_PROVIDER_SITE_OTHER): Payer: Medicare Other | Admitting: Internal Medicine

## 2013-01-31 ENCOUNTER — Encounter: Payer: Self-pay | Admitting: Internal Medicine

## 2013-01-31 VITALS — BP 112/78 | HR 76 | Temp 97.4°F | Ht 68.0 in | Wt 158.0 lb

## 2013-01-31 DIAGNOSIS — T07XXXA Unspecified multiple injuries, initial encounter: Secondary | ICD-10-CM | POA: Diagnosis not present

## 2013-01-31 DIAGNOSIS — J309 Allergic rhinitis, unspecified: Secondary | ICD-10-CM

## 2013-01-31 DIAGNOSIS — E785 Hyperlipidemia, unspecified: Secondary | ICD-10-CM | POA: Diagnosis not present

## 2013-01-31 DIAGNOSIS — Z0000 Encounter for general adult medical examination without abnormal findings: Secondary | ICD-10-CM

## 2013-01-31 DIAGNOSIS — I10 Essential (primary) hypertension: Secondary | ICD-10-CM | POA: Diagnosis not present

## 2013-01-31 LAB — POCT URINALYSIS DIPSTICK
Bilirubin, UA: NEGATIVE
Blood, UA: NEGATIVE
Glucose, UA: NEGATIVE
Nitrite, UA: NEGATIVE
Spec Grav, UA: 1.01

## 2013-01-31 MED ORDER — BENAZEPRIL HCL 20 MG PO TABS: 20.0000 mg | ORAL_TABLET | Freq: Every day | ORAL | Status: DC

## 2013-01-31 NOTE — Progress Notes (Signed)
Subjective:    Patient ID: Veronica Mcgrath, female    DOB: 24-May-1935, 78 y.o.   MRN: [id number]  HPI 78 year old White  Female for health maintenance and evaluation of medical problems including hypertension, hyperlipidemia, allergic rhinitis. Patient has had allergy symptoms recently with white sputum production. History of glaucoma.  Past medical history: Patient is status post cataract extraction both eyes around 2008. Lumbar spine surgery 2010.  Family history: Father died of colon cancer at age 25. Mother died of a stroke with history of diabetes mellitus at age 35. One sister in good health. 3 adult sons.  Social history: Patient is retired and is a widow. She does not smoke. Social alcohol consumption consisting of wine but does not consume it every day. She has a 4 year college education.  Patient had Pneumovax immunization 2003, gets annual and fluids immunization.  Patient had colonoscopy in 2011. She was placed on Zocor in 2011 but stopped taking it for several months because she thought it had some significant side effects. She's back on it nail without any issues whatsoever. She is on HCTZ and benazepril for hypertension. Has eye drops for glaucoma.  She recently had a fall and bruised her knees a few days ago. She basically was going up some steps at home and lost her balance.     Review of Systems  Constitutional: Negative.   HENT: Negative.   Eyes: Negative.   Respiratory: Negative.   Cardiovascular: Negative.   Gastrointestinal: Negative.   Endocrine: Negative.   Musculoskeletal:       Bruised knees bilaterally  Allergic/Immunologic: Positive for environmental allergies.  Neurological: Negative.   Hematological: Negative.   Psychiatric/Behavioral: Negative.        Objective:   Physical Exam  Constitutional: She is oriented to person, place, and time. She appears well-developed and well-nourished. No distress.  HENT:  Head: Normocephalic and atraumatic.   Left Ear: External ear normal.  Mouth/Throat: Oropharynx is clear and moist. No oropharyngeal exudate.  Eyes: Conjunctivae and EOM are normal. Pupils are equal, round, and reactive to light. Right eye exhibits no discharge. Left eye exhibits no discharge. No scleral icterus.  Genitourinary:  declined  Musculoskeletal: She exhibits no edema.  Neurological: She is alert and oriented to person, place, and time. She has normal reflexes. No cranial nerve deficit. Coordination normal.  Skin: Skin is warm and dry. She is not diaphoretic.   Minor contusions both knees bilaterally  Psychiatric: She has a normal mood and affect. Her behavior is normal. Judgment and thought content normal.          Assessment & Plan:  Hypertension-stable on diuretic and benazepril  Hyperlipidemia-stable on statin therapy  Contusions both knees secondary to fall at home  History of glaucoma  History of allergic rhinitis  Plan: Return one year or as needed. Refill  medications for one year.   Subjective:   Patient presents for Medicare Annual/Subsequent preventive examination.   Review Past Medical/Family/Social: No change in family or social history  Risk Factors  Current exercise habits: walk Dietary issues discussed: low carb low fat  Cardiac risk factors: HTN and hyperlipidemia  Depression Screen  (Note: if answer to either of the following is "Yes", a more complete depression screening is indicated)   Over the past two weeks, have you felt down, depressed or hopeless? No  Over the past two weeks, have you felt little interest or pleasure in doing things? No Have you lost interest or pleasure  in daily life? No Do you often feel hopeless? No Do you cry easily over simple problems? No   Activities of Daily Living  In your present state of health, do you have any difficulty performing the following activities?:   Driving? No  Managing money? No  Feeding yourself? No  Getting from bed to  chair? No  Climbing a flight of stairs? No  Preparing food and eating?: No  Bathing or showering? No  Getting dressed: No  Getting to the toilet? No  Using the toilet:No  Moving around from place to place: No  In the past year have you fallen or had a near fall?: yes one fall due to missing a step not a balance problem Are you sexually active? No  Do you have more than one partner? No   Hearing Difficulties: No  Do you often ask people to speak up or repeat themselves? No  Do you experience ringing or noises in your ears? No  Do you have difficulty understanding soft or whispered voices? No  Do you feel that you have a problem with memory? No Do you often misplace items? No    Home Safety:  Do you have a smoke alarm at your residence? Yes Do you have grab bars in the bathroom? no Do you have throw rugs in your house? yes   Cognitive Testing  Alert? Yes Normal Appearance?Yes  Oriented to person? Yes Place? Yes  Time? Yes  Recall of three objects? Yes  Can perform simple calculations? Yes  Displays appropriate judgment?Yes  Can read the correct time from a watch face?Yes   List the Names of Other Physician/Practitioners you currently use:  See referral list for the physicians patient is currently seeing. Dr. Ewing Schlein -GI; Dr.  Demetrio Lapping    Review of Systems: see above   Objective:     General appearance: Appears stated age and mildly obese  Head: Normocephalic, without obvious abnormality, atraumatic  Eyes: conj clear, EOMi PEERLA  Ears: normal TM's and external ear canals both ears  Nose: Nares normal. Septum midline. Mucosa normal. No drainage or sinus tenderness.  Throat: lips, mucosa, and tongue normal; teeth and gums normal  Neck: no adenopathy, no carotid bruit, no JVD, supple, symmetrical, trachea midline and thyroid not enlarged, symmetric, no tenderness/mass/nodules  No CVA tenderness.  Lungs: clear to auscultation bilaterally  Breasts: normal  appearance, no masses or tenderness. Heart: regular rate and rhythm, S1, S2 normal, no murmur, click, rub or gallop  Abdomen: soft, non-tender; bowel sounds normal; no masses, no organomegaly  Musculoskeletal: ROM normal in all joints, no crepitus, no deformity, Normal muscle strengthen. Back  is symmetric, no curvature. Skin: Skin color, texture, turgor normal. No rashes or lesions  Lymph nodes: Cervical, supraclavicular, and axillary nodes normal.  Neurologic: CN 2 -12 Normal, Normal symmetric reflexes. Normal coordination and gait  Psych: Alert & Oriented x 3, Mood appear stable.    Assessment:    Annual wellness medicare exam   Plan:    During the course of the visit the patient was educated and counseled about appropriate screening and preventive services including:   Mammogram reminder- order given Colonoscopy done by Dr. Ewing Schlein due 2016     Patient Instructions (the written plan) was given to the patient.  Medicare Attestation  I have personally reviewed:  The patient's medical and social history  Their use of alcohol, tobacco or illicit drugs  Their current medications and supplements  The patient's functional ability including ADLs,fall  risks, home safety risks, cognitive, and hearing and visual impairment  Diet and physical activities  Evidence for depression or mood disorders  The patient's weight, height, BMI, and visual acuity have been recorded in the chart. I have made referrals, counseling, and provided education to the patient based on review of the above and I have provided the patient with a written personalized care plan for preventive services.

## 2013-02-06 NOTE — Patient Instructions (Addendum)
Continue same medications and return in one year. Order given for mammogram.

## 2013-04-08 ENCOUNTER — Other Ambulatory Visit: Payer: Self-pay | Admitting: Internal Medicine

## 2013-06-19 ENCOUNTER — Ambulatory Visit (INDEPENDENT_AMBULATORY_CARE_PROVIDER_SITE_OTHER): Payer: Medicare Other | Admitting: Internal Medicine

## 2013-06-19 DIAGNOSIS — Z23 Encounter for immunization: Secondary | ICD-10-CM | POA: Diagnosis not present

## 2013-07-01 DIAGNOSIS — H409 Unspecified glaucoma: Secondary | ICD-10-CM | POA: Diagnosis not present

## 2013-07-01 DIAGNOSIS — H4011X0 Primary open-angle glaucoma, stage unspecified: Secondary | ICD-10-CM | POA: Diagnosis not present

## 2013-07-30 DIAGNOSIS — D3130 Benign neoplasm of unspecified choroid: Secondary | ICD-10-CM | POA: Diagnosis not present

## 2013-07-30 DIAGNOSIS — H353190 Nonexudative age-related macular degeneration, unspecified eye, stage unspecified: Secondary | ICD-10-CM | POA: Diagnosis not present

## 2013-10-08 ENCOUNTER — Other Ambulatory Visit: Payer: Self-pay | Admitting: Internal Medicine

## 2013-10-08 NOTE — Telephone Encounter (Signed)
Past due for 6 month recheck. Please book appt and refill for 30 days only

## 2013-10-09 NOTE — Telephone Encounter (Signed)
See previous note re OV

## 2013-10-24 ENCOUNTER — Encounter: Payer: Self-pay | Admitting: Internal Medicine

## 2013-10-24 ENCOUNTER — Ambulatory Visit (INDEPENDENT_AMBULATORY_CARE_PROVIDER_SITE_OTHER): Payer: Medicare Other | Admitting: Internal Medicine

## 2013-10-24 VITALS — BP 144/84 | HR 88 | Temp 99.1°F | Wt 162.0 lb

## 2013-10-24 DIAGNOSIS — I10 Essential (primary) hypertension: Secondary | ICD-10-CM | POA: Diagnosis not present

## 2013-10-24 MED ORDER — HYDROCHLOROTHIAZIDE 25 MG PO TABS: ORAL_TABLET | ORAL | Status: DC

## 2013-10-24 MED ORDER — BENAZEPRIL HCL 20 MG PO TABS: 20.0000 mg | ORAL_TABLET | Freq: Every day | ORAL | Status: DC

## 2013-10-24 NOTE — Patient Instructions (Addendum)
Continue same medications. Due for physical summer 2015.

## 2013-10-24 NOTE — Progress Notes (Signed)
   Subjective:    Patient ID: Veronica Mcgrath, female    DOB: 02-15-1935, 79 y.o.   MRN: [id number]  HPI 79 year old white female in today for blood pressure check. Doing well with no complaints. Blood pressure is a bit higher than usual today but she feels fine. 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    Review of Systems     Objective:   Physical Exam Neck is supple without JVD thyromegaly or carotid bruits. Chest clear. Cardiac exam regular rate and rhythm normal S1 and S2. Extremities without edema       Assessment & Plan:  Hypertension-stable however blood pressure is a little more elevated than it usually is. She has a guest in her house this week and it has been quite stressful for her.  Plan: Refill medications for one year. Physical exam due summer 2015.

## 2014-01-03 DIAGNOSIS — H4011X0 Primary open-angle glaucoma, stage unspecified: Secondary | ICD-10-CM | POA: Diagnosis not present

## 2014-01-03 DIAGNOSIS — H409 Unspecified glaucoma: Secondary | ICD-10-CM | POA: Diagnosis not present

## 2014-01-03 DIAGNOSIS — D3130 Benign neoplasm of unspecified choroid: Secondary | ICD-10-CM | POA: Diagnosis not present

## 2014-02-28 ENCOUNTER — Other Ambulatory Visit: Payer: Medicare Other | Admitting: Internal Medicine

## 2014-03-03 ENCOUNTER — Encounter: Payer: Medicare Other | Admitting: Internal Medicine

## 2014-03-28 ENCOUNTER — Other Ambulatory Visit: Payer: Medicare Other | Admitting: Internal Medicine

## 2014-03-28 DIAGNOSIS — Z130 Encounter for screening for diseases of the blood and blood-forming organs and certain disorders involving the immune mechanism: Secondary | ICD-10-CM

## 2014-03-28 DIAGNOSIS — I10 Essential (primary) hypertension: Secondary | ICD-10-CM | POA: Diagnosis not present

## 2014-03-28 DIAGNOSIS — Z79899 Other long term (current) drug therapy: Secondary | ICD-10-CM | POA: Diagnosis not present

## 2014-03-28 DIAGNOSIS — Z1329 Encounter for screening for other suspected endocrine disorder: Secondary | ICD-10-CM | POA: Diagnosis not present

## 2014-03-28 DIAGNOSIS — E785 Hyperlipidemia, unspecified: Secondary | ICD-10-CM | POA: Diagnosis not present

## 2014-03-28 LAB — COMPREHENSIVE METABOLIC PANEL
ALT: 16 U/L (ref 0–35)
AST: 16 U/L (ref 0–37)
Albumin: 4.2 g/dL (ref 3.5–5.2)
Alkaline Phosphatase: 42 U/L (ref 39–117)
BILIRUBIN TOTAL: 0.8 mg/dL (ref 0.2–1.2)
BUN: 13 mg/dL (ref 6–23)
CO2: 30 mEq/L (ref 19–32)
Calcium: 9.3 mg/dL (ref 8.4–10.5)
Chloride: 98 mEq/L (ref 96–112)
Creat: 0.81 mg/dL (ref 0.50–1.10)
Glucose, Bld: 92 mg/dL (ref 70–99)
Potassium: 4.2 mEq/L (ref 3.5–5.3)
SODIUM: 135 meq/L (ref 135–145)
Total Protein: 6.4 g/dL (ref 6.0–8.3)

## 2014-03-28 LAB — LIPID PANEL
Cholesterol: 163 mg/dL (ref 0–200)
HDL: 79 mg/dL (ref >39)
LDL CALC: 71 mg/dL (ref 0–99)
Total CHOL/HDL Ratio: 2.1 Ratio
Triglycerides: 67 mg/dL (ref <150)
VLDL: 13 mg/dL (ref 0–40)

## 2014-03-28 LAB — CBC WITH DIFFERENTIAL/PLATELET
BASOS PCT: 1 % (ref 0–1)
Basophils Absolute: 0 10*3/uL (ref 0.0–0.1)
Eosinophils Absolute: 0.1 10*3/uL (ref 0.0–0.7)
Eosinophils Relative: 2 % (ref 0–5)
HCT: 44.3 % (ref 36.0–46.0)
Hemoglobin: 15.1 g/dL — ABNORMAL HIGH (ref 12.0–15.0)
Lymphocytes Relative: 26 % (ref 12–46)
Lymphs Abs: 1.2 10*3/uL (ref 0.7–4.0)
MCH: 30.6 pg (ref 26.0–34.0)
MCHC: 34.1 g/dL (ref 30.0–36.0)
MCV: 89.7 fL (ref 78.0–100.0)
Monocytes Absolute: 0.6 10*3/uL (ref 0.1–1.0)
Monocytes Relative: 14 % — ABNORMAL HIGH (ref 3–12)
NEUTROS PCT: 57 % (ref 43–77)
Neutro Abs: 2.6 10*3/uL (ref 1.7–7.7)
PLATELETS: 265 10*3/uL (ref 150–400)
RBC: 4.94 MIL/uL (ref 3.87–5.11)
RDW: 13.6 % (ref 11.5–15.5)
WBC: 4.5 10*3/uL (ref 4.0–10.5)

## 2014-03-28 LAB — TSH: TSH: 2.253 u[IU]/mL (ref 0.350–4.500)

## 2014-03-31 ENCOUNTER — Ambulatory Visit (INDEPENDENT_AMBULATORY_CARE_PROVIDER_SITE_OTHER): Payer: Medicare Other | Admitting: Internal Medicine

## 2014-03-31 ENCOUNTER — Encounter: Payer: Self-pay | Admitting: Internal Medicine

## 2014-03-31 VITALS — BP 122/74 | HR 76 | Ht 68.25 in | Wt 152.0 lb

## 2014-03-31 DIAGNOSIS — I10 Essential (primary) hypertension: Secondary | ICD-10-CM | POA: Diagnosis not present

## 2014-03-31 DIAGNOSIS — J309 Allergic rhinitis, unspecified: Secondary | ICD-10-CM | POA: Diagnosis not present

## 2014-03-31 DIAGNOSIS — Z0000 Encounter for general adult medical examination without abnormal findings: Secondary | ICD-10-CM | POA: Diagnosis not present

## 2014-03-31 DIAGNOSIS — Z8669 Personal history of other diseases of the nervous system and sense organs: Secondary | ICD-10-CM

## 2014-03-31 DIAGNOSIS — E785 Hyperlipidemia, unspecified: Secondary | ICD-10-CM | POA: Diagnosis not present

## 2014-03-31 LAB — POCT URINALYSIS DIPSTICK
Bilirubin, UA: NEGATIVE
Blood, UA: NEGATIVE
Glucose, UA: NEGATIVE
Ketones, UA: NEGATIVE
LEUKOCYTES UA: NEGATIVE
Nitrite, UA: NEGATIVE
PROTEIN UA: NEGATIVE
SPEC GRAV UA: 1.015
UROBILINOGEN UA: NEGATIVE
pH, UA: 5

## 2014-03-31 MED ORDER — HYDROCHLOROTHIAZIDE 25 MG PO TABS: ORAL_TABLET | ORAL | Status: DC

## 2014-06-01 NOTE — Patient Instructions (Signed)
Continue same medications and return in one year. 

## 2014-06-01 NOTE — Progress Notes (Signed)
Subjective:    Patient ID: Veronica Mcgrath, female    DOB: 1935/02/06, 79 y.o.   MRN: [id number]  HPI  79 year old white female in today for health maintenance exam and evaluation of medical issues including hypertension, hyperlipidemia, allergic rhinitis. History of glaucoma. Thereafter has medical history: Patient is status post cataract extraction both eyes around 2008. Lumbar spine surgery 2010.  She had Pneumovax immunization in 2003. Gets annual flu vaccine.  No known drug allergies  Had colonoscopy in 2011 by Dr. Watt Climes. Hyperplastic polyp found.. She was placed on Zocor in 2011 for hyperlipidemia but stopped taking it for several months because she thought it had some significant side effects. She has resumed that medication. She is on HCTZ and benazepril for hypertension. Has eye drops for glaucoma.  Social history: She is retired. She is a widow. Does not smoke. Social alcohol consumption consisting of wine but does not consume it daily. Has a 4 year college education.  Family history: Father died of colon cancer at age 103. Mother died of a stroke with history of diabetes mellitus at age 26. One sister in good health. 3 adult sons.    Review of Systems  Constitutional: Negative.   All other systems reviewed and are negative.      Objective:   Physical Exam  Vitals reviewed. Constitutional: She is oriented to person, place, and time. She appears well-developed and well-nourished. No distress.  HENT:  Head: Normocephalic and atraumatic.  Right Ear: External ear normal.  Left Ear: External ear normal.  Mouth/Throat: Oropharynx is clear and moist. No oropharyngeal exudate.  Eyes: Conjunctivae and EOM are normal. Pupils are equal, round, and reactive to light. Right eye exhibits no discharge. No scleral icterus.  Neck: Neck supple. No JVD present. No thyromegaly present.  Cardiovascular: Normal rate, regular rhythm, normal heart sounds and intact distal pulses.   No murmur  heard. Pulmonary/Chest: Effort normal and breath sounds normal. No respiratory distress. She has no wheezes. She has no rales.  Breasts normal female  Abdominal: Soft. Bowel sounds are normal. She exhibits no distension and no mass. There is no tenderness. There is no rebound and no guarding.  Genitourinary:  Declines  Musculoskeletal: Normal range of motion. She exhibits no edema.  Lymphadenopathy:    She has no cervical adenopathy.  Neurological: She is alert and oriented to person, place, and time. She has normal reflexes. No cranial nerve deficit. Coordination normal.  Skin: Skin is warm and dry. No rash noted. She is not diaphoretic.  Psychiatric: She has a normal mood and affect. Her behavior is normal. Judgment and thought content normal.          Assessment & Plan:  Hypertension-stable  Hyperlipidemia-stable  History of glaucoma  History of allergic rhinitis  Plan: Return in one year or as needed  Subjective:   Patient presents for Medicare Annual/Subsequent preventive examination.  Review Past Medical/Family/Social: See above   Risk Factors  Current exercise habits: Discussed Dietary issues discussed: Low-fat low-carb  Cardiac risk factors: Hypertension and hyperlipidemia  Depression Screen  (Note: if answer to either of the following is "Yes", a more complete depression screening is indicated)   Over the past two weeks, have you felt down, depressed or hopeless? No  Over the past two weeks, have you felt little interest or pleasure in doing things? No Have you lost interest or pleasure in daily life? No Do you often feel hopeless? No Do you cry easily over simple problems? No  Activities of Daily Living  In your present state of health, do you have any difficulty performing the following activities?:   Driving? No  Managing money? No  Feeding yourself? No  Getting from bed to chair? No  Climbing a flight of stairs? No  Preparing food and eating?:  No  Bathing or showering? No  Getting dressed: No  Getting to the toilet? No  Using the toilet:No  Moving around from place to place: No  In the past year have you fallen or had a near fall?:No  Are you sexually active? No  Do you have more than one partner? No   Hearing Difficulties: No  Do you often ask people to speak up or repeat themselves? sometimes Do you experience ringing or noises in your ears? No  Do you have difficulty understanding soft or whispered voices? sometimes Do you feel that you have a problem with memory? No Do you often misplace items? occasionally   Home Safety:  Do you have a smoke alarm at your residence? Yes Do you have grab bars in the bathroom? no Do you have throw rugs in your house? no   Cognitive Testing  Alert? Yes Normal Appearance?Yes  Oriented to person? Yes Place? Yes  Time? Yes  Recall of three objects? Yes  Can perform simple calculations? Yes  Displays appropriate judgment?Yes  Can read the correct time from a watch face?Yes   List the Names of Other Physician/Practitioners you currently use:  See referral list for the physicians patient is currently seeing.   Dr. Watt Climes, GI.  Ophthalmologist for glaucoma  Review of Systems: See above   Objective:     General appearance: Appears stated age Head: Normocephalic, without obvious abnormality, atraumatic  Eyes: conj clear, EOMi PEERLA  Ears: normal TM's and external ear canals both ears  Nose: Nares normal. Septum midline. Mucosa normal. No drainage or sinus tenderness.  Throat: lips, mucosa, and tongue normal; teeth and gums normal  Neck: no adenopathy, no carotid bruit, no JVD, supple, symmetrical, trachea midline and thyroid not enlarged, symmetric, no tenderness/mass/nodules  No CVA tenderness.  Lungs: clear to auscultation bilaterally  Breasts: normal appearance, no masses or tenderness Heart: regular rate and rhythm, S1, S2 normal, no murmur, click, rub or gallop    Abdomen: soft, non-tender; bowel sounds normal; no masses, no organomegaly  Musculoskeletal: ROM normal in all joints, no crepitus, no deformity, Normal muscle strengthen. Back  is symmetric, no curvature. Skin: Skin color, texture, turgor normal. No rashes or lesions  Lymph nodes: Cervical, supraclavicular, and axillary nodes normal.  Neurologic: CN 2 -12 Normal, Normal symmetric reflexes. Normal coordination and gait  Psych: Alert & Oriented x 3, Mood appear stable.    Assessment:    Annual wellness medicare exam   Plan:    During the course of the visit the patient was educated and counseled about appropriate screening and preventive services including:   Recommend annual mammogram     Patient Instructions (the written plan) was given to the patient.  Medicare Attestation  I have personally reviewed:  The patient's medical and social history  Their use of alcohol, tobacco or illicit drugs  Their current medications and supplements  The patient's functional ability including ADLs,fall risks, home safety risks, cognitive, and hearing and visual impairment  Diet and physical activities  Evidence for depression or mood disorders  The patient's weight, height, BMI, and visual acuity have been recorded in the chart. I have made referrals, counseling, and provided  education to the patient based on review of the above and I have provided the patient with a written personalized care plan for preventive services.

## 2014-06-18 ENCOUNTER — Ambulatory Visit (INDEPENDENT_AMBULATORY_CARE_PROVIDER_SITE_OTHER): Payer: Medicare Other | Admitting: Internal Medicine

## 2014-06-18 DIAGNOSIS — Z23 Encounter for immunization: Secondary | ICD-10-CM | POA: Diagnosis not present

## 2014-06-29 NOTE — Patient Instructions (Signed)
Flu vaccine given.

## 2014-07-15 DIAGNOSIS — H4011X2 Primary open-angle glaucoma, moderate stage: Secondary | ICD-10-CM | POA: Diagnosis not present

## 2014-07-15 DIAGNOSIS — H3531 Nonexudative age-related macular degeneration: Secondary | ICD-10-CM | POA: Diagnosis not present

## 2014-07-15 DIAGNOSIS — H26492 Other secondary cataract, left eye: Secondary | ICD-10-CM | POA: Diagnosis not present

## 2014-07-15 DIAGNOSIS — H5213 Myopia, bilateral: Secondary | ICD-10-CM | POA: Diagnosis not present

## 2014-08-26 DIAGNOSIS — H35373 Puckering of macula, bilateral: Secondary | ICD-10-CM | POA: Diagnosis not present

## 2014-08-26 DIAGNOSIS — D3131 Benign neoplasm of right choroid: Secondary | ICD-10-CM | POA: Diagnosis not present

## 2014-08-26 DIAGNOSIS — H3531 Nonexudative age-related macular degeneration: Secondary | ICD-10-CM | POA: Diagnosis not present

## 2014-11-12 DIAGNOSIS — H4011X2 Primary open-angle glaucoma, moderate stage: Secondary | ICD-10-CM | POA: Diagnosis not present

## 2014-11-12 DIAGNOSIS — H3531 Nonexudative age-related macular degeneration: Secondary | ICD-10-CM | POA: Diagnosis not present

## 2015-01-16 ENCOUNTER — Other Ambulatory Visit: Payer: Self-pay | Admitting: Internal Medicine

## 2015-01-16 NOTE — Telephone Encounter (Signed)
Due for CPE  July Please call her

## 2015-01-16 NOTE — Telephone Encounter (Signed)
Patients appt for CPE booked and refill sent on lotinsen

## 2015-02-17 ENCOUNTER — Other Ambulatory Visit: Payer: Self-pay

## 2015-02-17 DIAGNOSIS — Z1231 Encounter for screening mammogram for malignant neoplasm of breast: Secondary | ICD-10-CM

## 2015-03-24 ENCOUNTER — Ambulatory Visit
Admission: RE | Admit: 2015-03-24 | Discharge: 2015-03-24 | Disposition: A | Discharge status: Home / Self Care | Payer: Medicare Other | Source: Ambulatory Visit

## 2015-03-24 DIAGNOSIS — Z1231 Encounter for screening mammogram for malignant neoplasm of breast: Secondary | ICD-10-CM | POA: Diagnosis not present

## 2015-04-06 ENCOUNTER — Other Ambulatory Visit: Payer: Medicare Other | Admitting: Internal Medicine

## 2015-04-06 DIAGNOSIS — E559 Vitamin D deficiency, unspecified: Secondary | ICD-10-CM

## 2015-04-06 DIAGNOSIS — R5383 Other fatigue: Secondary | ICD-10-CM

## 2015-04-06 DIAGNOSIS — E785 Hyperlipidemia, unspecified: Secondary | ICD-10-CM | POA: Diagnosis not present

## 2015-04-06 DIAGNOSIS — Z79899 Other long term (current) drug therapy: Secondary | ICD-10-CM

## 2015-04-06 LAB — COMPLETE METABOLIC PANEL WITH GFR
ALBUMIN: 4.3 g/dL (ref 3.6–5.1)
ALT: 17 U/L (ref 6–29)
AST: 16 U/L (ref 10–35)
Alkaline Phosphatase: 46 U/L (ref 33–130)
BILIRUBIN TOTAL: 0.8 mg/dL (ref 0.2–1.2)
BUN: 10 mg/dL (ref 7–25)
CALCIUM: 9.3 mg/dL (ref 8.6–10.4)
CO2: 30 mmol/L (ref 20–31)
CREATININE: 0.67 mg/dL (ref 0.60–0.93)
Chloride: 96 mmol/L — ABNORMAL LOW (ref 98–110)
GFR, Est Non African American: 84 mL/min (ref >=60)
GLUCOSE: 88 mg/dL (ref 65–99)
Potassium: 4 mmol/L (ref 3.5–5.3)
Sodium: 136 mmol/L (ref 135–146)
Total Protein: 6.6 g/dL (ref 6.1–8.1)

## 2015-04-06 LAB — CBC WITH DIFFERENTIAL/PLATELET
BASOS ABS: 0.1 10*3/uL (ref 0.0–0.1)
BASOS PCT: 1 % (ref 0–1)
EOS ABS: 0.1 10*3/uL (ref 0.0–0.7)
EOS PCT: 2 % (ref 0–5)
HEMATOCRIT: 44.7 % (ref 36.0–46.0)
HEMOGLOBIN: 14.8 g/dL (ref 12.0–15.0)
LYMPHS PCT: 31 % (ref 12–46)
Lymphs Abs: 1.6 10*3/uL (ref 0.7–4.0)
MCH: 30.1 pg (ref 26.0–34.0)
MCHC: 33.1 g/dL (ref 30.0–36.0)
MCV: 91 fL (ref 78.0–100.0)
MPV: 9.2 fL (ref 8.6–12.4)
Monocytes Absolute: 0.6 10*3/uL (ref 0.1–1.0)
Monocytes Relative: 12 % (ref 3–12)
Neutro Abs: 2.8 10*3/uL (ref 1.7–7.7)
Neutrophils Relative %: 54 % (ref 43–77)
Platelets: 253 10*3/uL (ref 150–400)
RBC: 4.91 MIL/uL (ref 3.87–5.11)
RDW: 13.3 % (ref 11.5–15.5)
WBC: 5.2 10*3/uL (ref 4.0–10.5)

## 2015-04-06 LAB — LIPID PANEL
CHOL/HDL RATIO: 2.6 ratio (ref <=5.0)
CHOLESTEROL: 193 mg/dL (ref 125–200)
HDL: 74 mg/dL (ref >=46)
LDL Cholesterol: 95 mg/dL (ref <130)
TRIGLYCERIDES: 121 mg/dL (ref <150)
VLDL: 24 mg/dL (ref <30)

## 2015-04-06 LAB — TSH: TSH: 2.277 u[IU]/mL (ref 0.350–4.500)

## 2015-04-07 ENCOUNTER — Encounter: Payer: Self-pay | Admitting: Internal Medicine

## 2015-04-07 ENCOUNTER — Ambulatory Visit (INDEPENDENT_AMBULATORY_CARE_PROVIDER_SITE_OTHER): Payer: Medicare Other | Admitting: Internal Medicine

## 2015-04-07 VITALS — BP 128/76 | HR 88 | Temp 98.0°F | Ht 68.0 in | Wt 158.5 lb

## 2015-04-07 DIAGNOSIS — Z800 Family history of malignant neoplasm of digestive organs: Secondary | ICD-10-CM

## 2015-04-07 DIAGNOSIS — I10 Essential (primary) hypertension: Secondary | ICD-10-CM

## 2015-04-07 DIAGNOSIS — E785 Hyperlipidemia, unspecified: Secondary | ICD-10-CM | POA: Diagnosis not present

## 2015-04-07 DIAGNOSIS — J309 Allergic rhinitis, unspecified: Secondary | ICD-10-CM

## 2015-04-07 DIAGNOSIS — H409 Unspecified glaucoma: Secondary | ICD-10-CM

## 2015-04-07 DIAGNOSIS — Z0000 Encounter for general adult medical examination without abnormal findings: Secondary | ICD-10-CM | POA: Diagnosis not present

## 2015-04-07 LAB — POCT URINALYSIS DIPSTICK
BILIRUBIN UA: NEGATIVE
Blood, UA: NEGATIVE
GLUCOSE UA: NEGATIVE
Ketones, UA: NEGATIVE
Leukocytes, UA: NEGATIVE
NITRITE UA: NEGATIVE
PROTEIN UA: NEGATIVE
SPEC GRAV UA: 1.01
UROBILINOGEN UA: NEGATIVE
pH, UA: 6

## 2015-04-07 LAB — VITAMIN D 25 HYDROXY (VIT D DEFICIENCY, FRACTURES): Vit D, 25-Hydroxy: 21 ng/mL — ABNORMAL LOW (ref 30–100)

## 2015-04-07 MED ORDER — BENAZEPRIL HCL 20 MG PO TABS: 20.0000 mg | ORAL_TABLET | Freq: Every day | ORAL | Status: DC

## 2015-04-07 MED ORDER — HYDROCHLOROTHIAZIDE 25 MG PO TABS: ORAL_TABLET | ORAL | Status: DC

## 2015-04-07 NOTE — Progress Notes (Signed)
Subjective:    Patient ID: Veronica Mcgrath, female    DOB: 1934-10-01, 80 y.o.   MRN: [id number]  HPI  80 year old Female in today for health maintenance exam and evaluation of medical issues including hypertension and hyperlipidemia. She has a history of glaucoma and allergic rhinitis.  Past medical history: Status post cataract extraction both eyes around 2008. Lumbar spine surgery 2010. Pneumococcal 23 vaccine in 2003. Gets annual flu vaccine.  No known drug allergies  Had colonoscopy in 2011 by Dr. Jess Barters God. Hyperplastic polyp found.  She was placed on Zocor in 2011 for hyperlipidemia but stopped taking it for several months because she thought it had some significant side effects. Subsequently resume that medication. She is on HCTZ 25 mg and benazepril 20 mg daily for hypertension. Uses glaucoma eyedrops.  Social history: She is retired. She is a widow. Does not smoke. Has a 4 year college education. Social alcohol consumption consisting of wine but does not consume it daily.  Family history: Father died of colon cancer at age 98. Mother died of a stroke with history of diabetes mellitus at age 13. One sister in good health. 3 adult sons.    Review of Systems  Constitutional: Negative.   All other systems reviewed and are negative.      Objective:   Physical Exam  Constitutional: She is oriented to person, place, and time. She appears well-developed and well-nourished. No distress.  HENT:  Head: Normocephalic and atraumatic.  Right Ear: External ear normal.  Left Ear: External ear normal.  Mouth/Throat: Oropharynx is clear and moist. No oropharyngeal exudate.  Eyes: Conjunctivae and EOM are normal. Pupils are equal, round, and reactive to light. Right eye exhibits no discharge. Left eye exhibits no discharge. No scleral icterus.  Neck: Neck supple. No JVD present. No thyromegaly present.  Cardiovascular: Normal rate, regular rhythm, normal heart sounds and intact distal  pulses.   No murmur heard. Pulmonary/Chest: Effort normal and breath sounds normal. She has no wheezes. She has no rales.  Breasts normal female  Abdominal: Soft. Bowel sounds are normal. She exhibits no distension and no mass. There is no tenderness. There is no rebound and no guarding.  Musculoskeletal: She exhibits no edema.  Lymphadenopathy:    She has no cervical adenopathy.  Neurological: She is alert and oriented to person, place, and time. She has normal reflexes.  Skin: Skin is warm and dry. No rash noted. She is not diaphoretic.  Psychiatric: She has a normal mood and affect. Her behavior is normal. Judgment and thought content normal.  Vitals reviewed.         Assessment & Plan:  Hypertension-stable on HCTZ and benazepril. There is no combination pill with the dosages she is taking  Hyperlipidemia-stable on statin medication  History of glaucoma  History of allergic rhinitis  Plan: Return in one year or as needed. She'll check with Dr. Watt Climes about follow-up colonoscopy. Have Prevnar in the near future  Subjective:   Patient presents for Medicare Annual/Subsequent preventive examination.  Review Past Medical/Family/Social: See above   Risk Factors  Current exercise habits: Walks Dietary issues discussed: Low fat low carbohydrate  Cardiac risk factors: Hypertension and hyperlipidemia  Depression Screen  (Note: if answer to either of the following is "Yes", a more complete depression screening is indicated)   Over the past two weeks, have you felt down, depressed or hopeless? No  Over the past two weeks, have you felt little interest or pleasure in doing  things? No Have you lost interest or pleasure in daily life? No Do you often feel hopeless? No Do you cry easily over simple problems? No   Activities of Daily Living  In your present state of health, do you have any difficulty performing the following activities?:   Driving? No  Managing money? No    Feeding yourself? No  Getting from bed to chair? No  Climbing a flight of stairs? No  Preparing food and eating?: No  Bathing or showering? No  Getting dressed: No  Getting to the toilet? No  Using the toilet:No  Moving around from place to place: No  In the past year have you fallen or had a near fall?:No  Are you sexually active? No  Do you have more than one partner? No   Hearing Difficulties: No  Do you often ask people to speak up or repeat themselves? Yes Do you experience ringing or noises in your ears? No  Do you have difficulty understanding soft or whispered voices? Yes Do you feel that you have a problem with memory? No Do you often misplace items? No    Home Safety:  Do you have a smoke alarm at your residence? Yes Do you have grab bars in the bathroom? No Do you have throw rugs in your house? No   Cognitive Testing  Alert? Yes Normal Appearance?Yes  Oriented to person? Yes Place? Yes  Time? Yes  Recall of three objects? Yes  Can perform simple calculations? Yes  Displays appropriate judgment?Yes  Can read the correct time from a watch face?Yes   List the Names of Other Physician/Practitioners you currently use:  See referral list for the physicians patient is currently seeing.  Ophthalmologist   Review of Systems: See above   Objective:     General appearance: Appears younger than stated age  Head: Normocephalic, without obvious abnormality, atraumatic  Eyes: conj clear, EOMi PEERLA  Ears: normal TM's and external ear canals both ears  Nose: Nares normal. Septum midline. Mucosa normal. No drainage or sinus tenderness.  Throat: lips, mucosa, and tongue normal; teeth and gums normal  Neck: no adenopathy, no carotid bruit, no JVD, supple, symmetrical, trachea midline and thyroid not enlarged, symmetric, no tenderness/mass/nodules  No CVA tenderness.  Lungs: clear to auscultation bilaterally  Breasts: normal appearance, no masses or  tenderness Heart: regular rate and rhythm, S1, S2 normal, no murmur, click, rub or gallop  Abdomen: soft, non-tender; bowel sounds normal; no masses, no organomegaly  Musculoskeletal: ROM normal in all joints, no crepitus, no deformity, Normal muscle strengthen. Back  is symmetric, no curvature. Skin: Skin color, texture, turgor normal. No rashes or lesions  Lymph nodes: Cervical, supraclavicular, and axillary nodes normal.  Neurologic: CN 2 -12 Normal, Normal symmetric reflexes. Normal coordination and gait  Psych: Alert & Oriented x 3, Mood appear stable.    Assessment:    Annual wellness medicare exam   Plan:    During the course of the visit the patient was educated and counseled about appropriate screening and preventive services including:  Annual mammogram recommended  Patient is to call Dr. Watt Climes about colonoscopy follow-up      Patient Instructions (the written plan) was given to the patient.  Medicare Attestation  I have personally reviewed:  The patient's medical and social history  Their use of alcohol, tobacco or illicit drugs  Their current medications and supplements  The patient's functional ability including ADLs,fall risks, home safety risks, cognitive, and hearing and  visual impairment  Diet and physical activities  Evidence for depression or mood disorders  The patient's weight, height, BMI, and visual acuity have been recorded in the chart. I have made referrals, counseling, and provided education to the patient based on review of the above and I have provided the patient with a written personalized care plan for preventive services.

## 2015-04-07 NOTE — Patient Instructions (Signed)
It was a pleasure to see you today. Continue same medications and return in one year. We will call you for Prevnar injection in the near future. Please contact Dr. Watt Climes in regard to follow-up colonoscopy

## 2015-05-04 DIAGNOSIS — H3531 Nonexudative age-related macular degeneration: Secondary | ICD-10-CM | POA: Diagnosis not present

## 2015-05-04 DIAGNOSIS — H4011X2 Primary open-angle glaucoma, moderate stage: Secondary | ICD-10-CM | POA: Diagnosis not present

## 2015-05-26 ENCOUNTER — Ambulatory Visit (INDEPENDENT_AMBULATORY_CARE_PROVIDER_SITE_OTHER): Payer: Medicare Other | Admitting: Internal Medicine

## 2015-05-26 DIAGNOSIS — Z23 Encounter for immunization: Secondary | ICD-10-CM | POA: Diagnosis not present

## 2015-07-01 ENCOUNTER — Ambulatory Visit (INDEPENDENT_AMBULATORY_CARE_PROVIDER_SITE_OTHER): Payer: Medicare Other | Admitting: Internal Medicine

## 2015-07-01 ENCOUNTER — Encounter: Payer: Self-pay | Admitting: Internal Medicine

## 2015-07-01 VITALS — BP 158/78 | HR 90 | Temp 97.5°F | Wt 162.0 lb

## 2015-07-01 DIAGNOSIS — Z23 Encounter for immunization: Secondary | ICD-10-CM | POA: Diagnosis not present

## 2015-07-01 NOTE — Progress Notes (Signed)
Patient seen today for Prevnar vaccine.  Patient tolerated well.

## 2015-07-22 DIAGNOSIS — H353132 Nonexudative age-related macular degeneration, bilateral, intermediate dry stage: Secondary | ICD-10-CM | POA: Diagnosis not present

## 2015-07-22 DIAGNOSIS — H401112 Primary open-angle glaucoma, right eye, moderate stage: Secondary | ICD-10-CM | POA: Diagnosis not present

## 2015-07-22 DIAGNOSIS — H1789 Other corneal scars and opacities: Secondary | ICD-10-CM | POA: Diagnosis not present

## 2015-07-22 DIAGNOSIS — H401122 Primary open-angle glaucoma, left eye, moderate stage: Secondary | ICD-10-CM | POA: Diagnosis not present

## 2015-09-16 DIAGNOSIS — H43813 Vitreous degeneration, bilateral: Secondary | ICD-10-CM | POA: Diagnosis not present

## 2015-09-16 DIAGNOSIS — D3131 Benign neoplasm of right choroid: Secondary | ICD-10-CM | POA: Diagnosis not present

## 2015-09-16 DIAGNOSIS — H35373 Puckering of macula, bilateral: Secondary | ICD-10-CM | POA: Diagnosis not present

## 2015-09-16 DIAGNOSIS — H353132 Nonexudative age-related macular degeneration, bilateral, intermediate dry stage: Secondary | ICD-10-CM | POA: Diagnosis not present

## 2015-11-16 ENCOUNTER — Other Ambulatory Visit: Payer: Self-pay | Admitting: Gastroenterology

## 2015-11-16 DIAGNOSIS — K621 Rectal polyp: Secondary | ICD-10-CM | POA: Diagnosis not present

## 2015-11-16 DIAGNOSIS — K5730 Diverticulosis of large intestine without perforation or abscess without bleeding: Secondary | ICD-10-CM | POA: Diagnosis not present

## 2015-11-16 DIAGNOSIS — D124 Benign neoplasm of descending colon: Secondary | ICD-10-CM | POA: Diagnosis not present

## 2015-11-16 DIAGNOSIS — Z86010 Personal history of colonic polyps: Secondary | ICD-10-CM | POA: Diagnosis not present

## 2015-11-16 DIAGNOSIS — D123 Benign neoplasm of transverse colon: Secondary | ICD-10-CM | POA: Diagnosis not present

## 2016-01-19 DIAGNOSIS — Z961 Presence of intraocular lens: Secondary | ICD-10-CM | POA: Diagnosis not present

## 2016-01-19 DIAGNOSIS — H353131 Nonexudative age-related macular degeneration, bilateral, early dry stage: Secondary | ICD-10-CM | POA: Diagnosis not present

## 2016-01-19 DIAGNOSIS — H401111 Primary open-angle glaucoma, right eye, mild stage: Secondary | ICD-10-CM | POA: Diagnosis not present

## 2016-01-19 DIAGNOSIS — H401122 Primary open-angle glaucoma, left eye, moderate stage: Secondary | ICD-10-CM | POA: Diagnosis not present

## 2016-04-07 ENCOUNTER — Other Ambulatory Visit: Payer: Medicare Other | Admitting: Internal Medicine

## 2016-04-07 DIAGNOSIS — Z1321 Encounter for screening for nutritional disorder: Secondary | ICD-10-CM | POA: Diagnosis not present

## 2016-04-07 DIAGNOSIS — Z130 Encounter for screening for diseases of the blood and blood-forming organs and certain disorders involving the immune mechanism: Secondary | ICD-10-CM | POA: Diagnosis not present

## 2016-04-07 DIAGNOSIS — Z0000 Encounter for general adult medical examination without abnormal findings: Secondary | ICD-10-CM

## 2016-04-07 DIAGNOSIS — E785 Hyperlipidemia, unspecified: Secondary | ICD-10-CM

## 2016-04-07 DIAGNOSIS — Z1329 Encounter for screening for other suspected endocrine disorder: Secondary | ICD-10-CM | POA: Diagnosis not present

## 2016-04-07 DIAGNOSIS — I10 Essential (primary) hypertension: Secondary | ICD-10-CM | POA: Diagnosis not present

## 2016-04-07 LAB — COMPLETE METABOLIC PANEL WITH GFR
ALBUMIN: 4.2 g/dL (ref 3.6–5.1)
ALK PHOS: 45 U/L (ref 33–130)
ALT: 13 U/L (ref 6–29)
AST: 14 U/L (ref 10–35)
BUN: 13 mg/dL (ref 7–25)
CALCIUM: 9.3 mg/dL (ref 8.6–10.4)
CO2: 29 mmol/L (ref 20–31)
CREATININE: 0.79 mg/dL (ref 0.60–0.93)
Chloride: 99 mmol/L (ref 98–110)
GFR, Est African American: 82 mL/min (ref >=60)
GFR, Est Non African American: 71 mL/min (ref >=60)
Glucose, Bld: 90 mg/dL (ref 65–99)
POTASSIUM: 4 mmol/L (ref 3.5–5.3)
Sodium: 138 mmol/L (ref 135–146)
TOTAL PROTEIN: 6.9 g/dL (ref 6.1–8.1)
Total Bilirubin: 0.5 mg/dL (ref 0.2–1.2)

## 2016-04-07 LAB — CBC WITH DIFFERENTIAL/PLATELET
BASOS ABS: 49 {cells}/uL (ref 0–200)
Basophils Relative: 1 %
EOS ABS: 147 {cells}/uL (ref 15–500)
Eosinophils Relative: 3 %
HEMATOCRIT: 45.9 % — AB (ref 35.0–45.0)
HEMOGLOBIN: 15.1 g/dL (ref 11.7–15.5)
LYMPHS PCT: 30 %
Lymphs Abs: 1470 cells/uL (ref 850–3900)
MCH: 29.9 pg (ref 27.0–33.0)
MCHC: 32.9 g/dL (ref 32.0–36.0)
MCV: 90.9 fL (ref 80.0–100.0)
MONOS PCT: 11 %
MPV: 9.4 fL (ref 7.5–12.5)
Monocytes Absolute: 539 cells/uL (ref 200–950)
Neutro Abs: 2695 cells/uL (ref 1500–7800)
Neutrophils Relative %: 55 %
Platelets: 288 10*3/uL (ref 140–400)
RBC: 5.05 MIL/uL (ref 3.80–5.10)
RDW: 13.5 % (ref 11.0–15.0)
WBC: 4.9 10*3/uL (ref 3.8–10.8)

## 2016-04-07 LAB — LIPID PANEL
Cholesterol: 230 mg/dL — ABNORMAL HIGH (ref 125–200)
HDL: 79 mg/dL (ref >=46)
LDL Cholesterol: 129 mg/dL (ref <130)
Total CHOL/HDL Ratio: 2.9 Ratio (ref <=5.0)
Triglycerides: 111 mg/dL (ref <150)
VLDL: 22 mg/dL (ref <30)

## 2016-04-07 LAB — TSH: TSH: 2.96 mIU/L

## 2016-04-08 LAB — VITAMIN D 25 HYDROXY (VIT D DEFICIENCY, FRACTURES): VIT D 25 HYDROXY: 33 ng/mL (ref 30–100)

## 2016-04-11 ENCOUNTER — Ambulatory Visit (INDEPENDENT_AMBULATORY_CARE_PROVIDER_SITE_OTHER): Payer: Medicare Other | Admitting: Internal Medicine

## 2016-04-11 ENCOUNTER — Encounter: Payer: Self-pay | Admitting: Internal Medicine

## 2016-04-11 VITALS — BP 132/84 | HR 91 | Temp 97.4°F | Ht 68.0 in | Wt 159.0 lb

## 2016-04-11 DIAGNOSIS — J309 Allergic rhinitis, unspecified: Secondary | ICD-10-CM

## 2016-04-11 DIAGNOSIS — Z0000 Encounter for general adult medical examination without abnormal findings: Secondary | ICD-10-CM | POA: Diagnosis not present

## 2016-04-11 DIAGNOSIS — E785 Hyperlipidemia, unspecified: Secondary | ICD-10-CM | POA: Diagnosis not present

## 2016-04-11 DIAGNOSIS — I10 Essential (primary) hypertension: Secondary | ICD-10-CM | POA: Diagnosis not present

## 2016-04-11 LAB — POCT URINALYSIS DIPSTICK
Bilirubin, UA: NEGATIVE
GLUCOSE UA: NEGATIVE
KETONES UA: NEGATIVE
Leukocytes, UA: NEGATIVE
Nitrite, UA: NEGATIVE
Protein, UA: NEGATIVE
RBC UA: NEGATIVE
SPEC GRAV UA: 1.015
Urobilinogen, UA: 0.2
pH, UA: 6

## 2016-04-11 MED ORDER — BENAZEPRIL HCL 20 MG PO TABS: 20.0000 mg | ORAL_TABLET | Freq: Every day | ORAL | 3 refills | Status: DC

## 2016-04-11 MED ORDER — HYDROCHLOROTHIAZIDE 25 MG PO TABS: ORAL_TABLET | ORAL | 3 refills | Status: DC

## 2016-04-11 NOTE — Progress Notes (Signed)
Subjective:    Patient ID: Veronica Mcgrath, female    DOB: 1934-09-15, 81 y.o.   MRN: [id number]  HPI 81 year old White Female for health maintenance and evaluation of medical issues. She has a history of hypertension and hyperlipidemia. Stopped taking statin medication. She had no side effects but became concerned about potential long-term effects of statin medicine. She has a history of glaucoma and allergic rhinitis.  Past medical history: Status post cataract extraction both eyes around 2008. Lumbar spine surgery 2010.  Gets annual flu vaccine  No known drug allergies  Had colonoscopy in 2011 by Dr. Watt Climes. Hyperplastic polyp found.  Social history: She is retired. She is a widow. Has a 4 year college education. Social alcohol consumption consisting of wine but does not consume it daily. Does not smoke.  Family history: Father died of colon cancer at age 57. Mother died of a stroke with history of diabetes mellitus at age 17. One sister in good health. 3 adult sons.    Review of Systems  Constitutional: Negative.   All other systems reviewed and are negative.      Objective:   Physical Exam  Constitutional: She is oriented to person, place, and time. She appears well-developed and well-nourished. No distress.  HENT:  Head: Normocephalic and atraumatic.  Right Ear: External ear normal.  Left Ear: External ear normal.  Mouth/Throat: No oropharyngeal exudate.  Eyes: Conjunctivae are normal. Pupils are equal, round, and reactive to light. Right eye exhibits no discharge. Left eye exhibits no discharge.  Neck: Neck supple. No JVD present. No thyromegaly present.  Cardiovascular: Normal rate, regular rhythm, normal heart sounds and intact distal pulses.   No murmur heard. Pulmonary/Chest: Effort normal and breath sounds normal. No respiratory distress. She has no wheezes.  Breasts normal female without masses  Abdominal: Soft. Bowel sounds are normal. She exhibits no  distension and no mass. There is no tenderness. There is no rebound and no guarding.  Genitourinary:  Genitourinary Comments: Bimanual normal  Musculoskeletal: She exhibits no edema.  Neurological: She is alert and oriented to person, place, and time. She has normal reflexes. No cranial nerve deficit. Coordination normal.  Skin: Skin is warm and dry. No rash noted. She is not diaphoretic.  Psychiatric: She has a normal mood and affect. Her behavior is normal. Judgment and thought content normal.  Vitals reviewed.         Assessment & Plan:  Essential hypertension-stable on current regimen  Hyperlipidemia-total cholesterol has increased to 2:30 but LDL cholesterol is still within normal limits. She's off statin therapy and has concerns about long-term side effects although she had no symptoms of muscle pain etc. We have agreed she will stay off of it for year and follow-up at that time  Allergic rhinitis  History of glaucoma  Plan: Return in one year or as needed..  Subjective:   Patient presents for Medicare Annual/Subsequent preventive examination.  Review Past Medical/Family/Social: See above   Risk Factors  Current exercise habits: Fairly active about her home Dietary issues discussed: Low fat low carbohydrate  Cardiac risk factors: Hyperlipidemia, hypertension, family history of stroke in mother  Depression Screen  (Note: if answer to either of the following is "Yes", a more complete depression screening is indicated)   Over the past two weeks, have you felt down, depressed or hopeless? No  Over the past two weeks, have you felt little interest or pleasure in doing things? No Have you lost interest or pleasure in  daily life? No Do you often feel hopeless? No Do you cry easily over simple problems? No   Activities of Daily Living  In your present state of health, do you have any difficulty performing the following activities?:   Driving? No  Managing money? No    Feeding yourself? No  Getting from bed to chair? No  Climbing a flight of stairs? No  Preparing food and eating?: No  Bathing or showering? No  Getting dressed: No  Getting to the toilet? No  Using the toilet:No  Moving around from place to place: No  In the past year have you fallen or had a near fall?:No  Are you sexually active? No  Do you have more than one partner? No   Hearing Difficulties: No  Do you often ask people to speak up or repeat themselves? Yes Do you experience ringing or noises in your ears? No  Do you have difficulty understanding soft or whispered voices? Yes Do you feel that you have a problem with memory? No Do you often misplace items? No    Home Safety:  Do you have a smoke alarm at your residence? Yes Do you have grab bars in the bathroom? No Do you have throw rugs in your house? No   Cognitive Testing  Alert? Yes Normal Appearance?Yes  Oriented to person? Yes Place? Yes  Time? Yes  Recall of three objects? Yes  Can perform simple calculations? Yes  Displays appropriate judgment?Yes  Can read the correct time from a watch face?Yes   List the Names of Other Physician/Practitioners you currently use:  See referral list for the physicians patient is currently seeing.     Review of Systems: See above   Objective:     General appearance: Appears stated age   Head: Normocephalic, without obvious abnormality, atraumatic  Eyes: conj clear, EOMi PEERLA  Ears: normal TM's and external ear canals both ears  Nose: Nares normal. Septum midline. Mucosa normal. No drainage or sinus tenderness.  Throat: lips, mucosa, and tongue normal; teeth and gums normal  Neck: no adenopathy, no carotid bruit, no JVD, supple, symmetrical, trachea midline and thyroid not enlarged, symmetric, no tenderness/mass/nodules  No CVA tenderness.  Lungs: clear to auscultation bilaterally  Breasts: normal appearance, no masses or tenderness. Heart: regular rate and rhythm,  S1, S2 normal, no murmur, click, rub or gallop  Abdomen: soft, non-tender; bowel sounds normal; no masses, no organomegaly  Musculoskeletal: ROM normal in all joints, no crepitus, no deformity, Normal muscle strengthen. Back  is symmetric, no curvature. Skin: Skin color, texture, turgor normal. No rashes or lesions  Lymph nodes: Cervical, supraclavicular, and axillary nodes normal.  Neurologic: CN 2 -12 Normal, Normal symmetric reflexes. Normal coordination and gait  Psych: Alert & Oriented x 3, Mood appear stable.    Assessment:    Annual wellness medicare exam   Plan:    During the course of the visit the patient was educated and counseled about appropriate screening and preventive services including:  Recommend annual flu vaccine  Other immunizations are up-to-date      Patient Instructions (the written plan) was given to the patient.  Medicare Attestation  I have personally reviewed:  The patient's medical and social history  Their use of alcohol, tobacco or illicit drugs  Their current medications and supplements  The patient's functional ability including ADLs,fall risks, home safety risks, cognitive, and hearing and visual impairment  Diet and physical activities  Evidence for depression or mood disorders  The patient's weight, height, BMI, and visual acuity have been recorded in the chart. I have made referrals, counseling, and provided education to the patient based on review of the above and I have provided the patient with a written personalized care plan for preventive services.

## 2016-04-11 NOTE — Patient Instructions (Signed)
It was a pleasure to see you today. Continue same medications. Stay off statin medicine. Return in one year or as needed.

## 2016-04-13 ENCOUNTER — Other Ambulatory Visit: Payer: Self-pay | Admitting: Internal Medicine

## 2016-04-13 DIAGNOSIS — Z1231 Encounter for screening mammogram for malignant neoplasm of breast: Secondary | ICD-10-CM

## 2016-05-06 ENCOUNTER — Other Ambulatory Visit: Payer: Self-pay

## 2016-07-06 ENCOUNTER — Telehealth: Payer: Self-pay | Admitting: Internal Medicine

## 2016-07-06 NOTE — Telephone Encounter (Signed)
Patient had called on Monday and was told to call back and speak with me per [employer].  Patient received a bill from [facility] for $99 related to her Vitamin D that was drawn in July 2017.  Patient called this  Morning; advised patient that this is a billing issue between her and [facility].  Patient advised that Medicare didn't pay for this and BCBS will pay for anything that Medicare denies.  Then advised patient to contact [facility] and speak with them in this regard.    Patient advised she had contacted [employer] but no one had called her back.  Patient was quite frustrated because she has spent so much time on the phone.  States that this was taken off of her bill before.  Explained to the patient again that we do not bill for the labs.  Therefore, she will have to deal with BCBS and [facility] regarding this bill.    Patient states this is for the Vitamin D charge.  Advised patient that Medicare doesn't pay for Vitamin D and I would be highly surprised if BCBS pays for Vitamin D since Medicare doesn't.  Patient later called back stating that she would have to have the code changed in order for this to be paid.  Advised that is correct, however, I would not be willing to change any codes unless there is documentation to support that.  We will not do any fraudulent billing/coding in order for her to have this paid for.    However, in looking back over patient's chart, all the way back to 2006, patient has a documented history of Osteopenia.  Called [facility], spoke with Asencion Partridge in [place] @ [phone], Accession [id number] and provided updated diagnoses code of M85.80 for DOS 04/07/16.  They will refile the claim to Medicare/BCBS.    Called patient back and she is ecstatic!!    Patient DOES NOT want to have VITAMIN D drawn again if Medicare isn't going to pay for it.  Advised patient that she needs to tell us this when she comes in to have her labs drawn.  She can remind [place] of this at the time of services.

## 2016-07-15 ENCOUNTER — Ambulatory Visit (INDEPENDENT_AMBULATORY_CARE_PROVIDER_SITE_OTHER): Payer: Medicare Other | Admitting: Internal Medicine

## 2016-07-15 DIAGNOSIS — Z23 Encounter for immunization: Secondary | ICD-10-CM

## 2016-07-18 NOTE — Progress Notes (Signed)
Flu vaccine given.

## 2016-07-19 DIAGNOSIS — H401122 Primary open-angle glaucoma, left eye, moderate stage: Secondary | ICD-10-CM | POA: Diagnosis not present

## 2016-07-19 DIAGNOSIS — H401111 Primary open-angle glaucoma, right eye, mild stage: Secondary | ICD-10-CM | POA: Diagnosis not present

## 2016-07-19 DIAGNOSIS — H353132 Nonexudative age-related macular degeneration, bilateral, intermediate dry stage: Secondary | ICD-10-CM | POA: Diagnosis not present

## 2016-09-27 MED FILL — TRAVATAN Z 0.004% EYE DROP: 0.004 | 30 days supply | Qty: 3 | Fill #0

## 2016-10-19 DIAGNOSIS — H353132 Nonexudative age-related macular degeneration, bilateral, intermediate dry stage: Secondary | ICD-10-CM | POA: Diagnosis not present

## 2016-10-19 DIAGNOSIS — H35373 Puckering of macula, bilateral: Secondary | ICD-10-CM | POA: Diagnosis not present

## 2016-10-19 DIAGNOSIS — H43813 Vitreous degeneration, bilateral: Secondary | ICD-10-CM | POA: Diagnosis not present

## 2016-10-19 DIAGNOSIS — D3131 Benign neoplasm of right choroid: Secondary | ICD-10-CM | POA: Diagnosis not present

## 2016-10-26 MED FILL — BENAZEPRIL HCL 20 MG TABLET: 20 | 90 days supply | Qty: 90 | Fill #0

## 2016-10-26 MED FILL — HYDROCHLOROTHIAZIDE 25 MG T: 25 | 90 days supply | Qty: 90 | Fill #0

## 2016-11-17 MED FILL — TRAVATAN Z 0.004% EYE DROP: 0.004 | 30 days supply | Qty: 3 | Fill #1

## 2017-01-03 MED FILL — LATANOPROST 0.005% EYE DRP: 0.005 | 75 days supply | Qty: 8 | Fill #0

## 2017-01-23 DIAGNOSIS — H52203 Unspecified astigmatism, bilateral: Secondary | ICD-10-CM | POA: Diagnosis not present

## 2017-01-23 DIAGNOSIS — H401122 Primary open-angle glaucoma, left eye, moderate stage: Secondary | ICD-10-CM | POA: Diagnosis not present

## 2017-01-23 DIAGNOSIS — H401111 Primary open-angle glaucoma, right eye, mild stage: Secondary | ICD-10-CM | POA: Diagnosis not present

## 2017-01-23 MED FILL — HYDROCHLOROTHIAZIDE 25 MG T: 25 | 90 days supply | Qty: 90 | Fill #1

## 2017-01-23 MED FILL — BENAZEPRIL HCL 20 MG TABLET: 20 | 90 days supply | Qty: 90 | Fill #1

## 2017-04-14 ENCOUNTER — Other Ambulatory Visit: Payer: Medicare Other | Admitting: Internal Medicine

## 2017-04-14 DIAGNOSIS — Z1329 Encounter for screening for other suspected endocrine disorder: Secondary | ICD-10-CM | POA: Diagnosis not present

## 2017-04-14 DIAGNOSIS — Z0000 Encounter for general adult medical examination without abnormal findings: Secondary | ICD-10-CM

## 2017-04-14 DIAGNOSIS — I10 Essential (primary) hypertension: Secondary | ICD-10-CM

## 2017-04-14 DIAGNOSIS — E785 Hyperlipidemia, unspecified: Secondary | ICD-10-CM | POA: Diagnosis not present

## 2017-04-14 LAB — CBC WITH DIFFERENTIAL/PLATELET
BASOS PCT: 1 %
Basophils Absolute: 53 cells/uL (ref 0–200)
EOS ABS: 159 {cells}/uL (ref 15–500)
Eosinophils Relative: 3 %
HEMATOCRIT: 46.2 % — AB (ref 35.0–45.0)
Hemoglobin: 15.2 g/dL (ref 11.7–15.5)
LYMPHS ABS: 1484 {cells}/uL (ref 850–3900)
Lymphocytes Relative: 28 %
MCH: 30.6 pg (ref 27.0–33.0)
MCHC: 32.9 g/dL (ref 32.0–36.0)
MCV: 93 fL (ref 80.0–100.0)
MONO ABS: 689 {cells}/uL (ref 200–950)
MPV: 9.2 fL (ref 7.5–12.5)
Monocytes Relative: 13 %
NEUTROS ABS: 2915 {cells}/uL (ref 1500–7800)
Neutrophils Relative %: 55 %
Platelets: 302 10*3/uL (ref 140–400)
RBC: 4.97 MIL/uL (ref 3.80–5.10)
RDW: 13.8 % (ref 11.0–15.0)
WBC: 5.3 10*3/uL (ref 3.8–10.8)

## 2017-04-15 LAB — LIPID PANEL
CHOLESTEROL: 226 mg/dL — AB (ref <200)
HDL: 85 mg/dL (ref >50)
LDL Cholesterol: 127 mg/dL — ABNORMAL HIGH (ref <100)
TRIGLYCERIDES: 70 mg/dL (ref <150)
Total CHOL/HDL Ratio: 2.7 Ratio (ref <5.0)
VLDL: 14 mg/dL (ref <30)

## 2017-04-15 LAB — TSH: TSH: 2.35 mIU/L

## 2017-04-15 LAB — COMPLETE METABOLIC PANEL WITH GFR
ALBUMIN: 4.2 g/dL (ref 3.6–5.1)
ALK PHOS: 45 U/L (ref 33–130)
ALT: 12 U/L (ref 6–29)
AST: 14 U/L (ref 10–35)
BUN: 11 mg/dL (ref 7–25)
CALCIUM: 9.6 mg/dL (ref 8.6–10.4)
CHLORIDE: 97 mmol/L — AB (ref 98–110)
CO2: 27 mmol/L (ref 20–31)
CREATININE: 0.8 mg/dL (ref 0.60–0.88)
GFR, EST AFRICAN AMERICAN: 81 mL/min (ref >=60)
GFR, Est Non African American: 70 mL/min (ref >=60)
Glucose, Bld: 81 mg/dL (ref 65–99)
POTASSIUM: 4 mmol/L (ref 3.5–5.3)
Sodium: 135 mmol/L (ref 135–146)
Total Bilirubin: 0.7 mg/dL (ref 0.2–1.2)
Total Protein: 6.9 g/dL (ref 6.1–8.1)

## 2017-04-18 ENCOUNTER — Ambulatory Visit (INDEPENDENT_AMBULATORY_CARE_PROVIDER_SITE_OTHER): Payer: Medicare Other | Admitting: Internal Medicine

## 2017-04-18 ENCOUNTER — Encounter: Payer: Self-pay | Admitting: Internal Medicine

## 2017-04-18 VITALS — BP 128/70 | HR 91 | Temp 99.2°F | Ht 67.5 in | Wt 161.0 lb

## 2017-04-18 DIAGNOSIS — Z0000 Encounter for general adult medical examination without abnormal findings: Secondary | ICD-10-CM | POA: Diagnosis not present

## 2017-04-18 DIAGNOSIS — E7800 Pure hypercholesterolemia, unspecified: Secondary | ICD-10-CM

## 2017-04-18 DIAGNOSIS — I10 Essential (primary) hypertension: Secondary | ICD-10-CM

## 2017-04-18 LAB — POCT URINALYSIS DIPSTICK
BILIRUBIN UA: NEGATIVE
Blood, UA: NEGATIVE
Glucose, UA: NEGATIVE
KETONES UA: NEGATIVE
Leukocytes, UA: NEGATIVE
Nitrite, UA: NEGATIVE
PROTEIN UA: NEGATIVE
SPEC GRAV UA: 1.01 (ref 1.010–1.025)
Urobilinogen, UA: 0.2 E.U./dL
pH, UA: 5 (ref 5.0–8.0)

## 2017-04-18 MED ORDER — HYDROCHLOROTHIAZIDE 25 MG PO TABS: ORAL_TABLET | ORAL | 3 refills | Status: DC

## 2017-04-18 MED ORDER — BENAZEPRIL HCL 20 MG PO TABS: 20.0000 mg | ORAL_TABLET | Freq: Every day | ORAL | 3 refills | Status: DC

## 2017-04-18 MED FILL — HYDROCHLOROTHIAZIDE 25 MG T: 25 | 90 days supply | Qty: 90 | Fill #0

## 2017-04-18 MED FILL — BENAZEPRIL HCL 20 MG TABLET: 20 | 90 days supply | Qty: 90 | Fill #0

## 2017-04-18 NOTE — Progress Notes (Signed)
Subjective:    Patient ID: Veronica Mcgrath, female    DOB: Sep 02, 1935, 82 y.o.   MRN: [id number]  HPI  82 year old Female for Medicare Wellness exam and health care maintenance exam. History of hypertension and hyperlipidemia. She stopped taking statin medication. She had no side effects but became concerned about potential long-term effects of statin medicine. She has a history of glaucoma and allergic rhinitis.  Past medical history: Status post cataract extraction both eyes round 2008. Lumbar spine surgery 2010.  No known drug allergies  Gets annual flu vaccine  A colonoscopy in 2011 by Dr. Watt Climes and a hyperplastic polyp was found.  Social history: She is retired. She is a widow. She has a 4 year college education. Social alcohol consumption consisting of wine but does not consume it daily. Does not smoke.  Family history: Father died of colon cancer at age 27. Mother died of a stroke with history of diabetes mellitus at age 12. One sister in good health. 3 adult sons.    Review of Systems  Constitutional: Negative.   All other systems reviewed and are negative.      Objective:   Physical Exam  Constitutional: She is oriented to person, place, and time. She appears well-developed and well-nourished. No distress.  HENT:  Head: Normocephalic and atraumatic.  Right Ear: External ear normal.  Left Ear: External ear normal.  Mouth/Throat: Oropharynx is clear and moist.  Eyes: Pupils are equal, round, and reactive to light. Conjunctivae and EOM are normal. Right eye exhibits no discharge. Left eye exhibits no discharge.  Neck: Neck supple. No JVD present. No thyromegaly present.  Cardiovascular: Normal rate, regular rhythm, normal heart sounds and intact distal pulses.   No murmur heard. Pulmonary/Chest: Effort normal and breath sounds normal. No respiratory distress. She has no wheezes. She has no rales.  Abdominal: Soft. Bowel sounds are normal. She exhibits no distension  and no mass. There is no tenderness. There is no rebound and no guarding.  Genitourinary:  Genitourinary Comments: Bimanual normal  Musculoskeletal: She exhibits no edema.  Neurological: She is alert and oriented to person, place, and time. She has normal reflexes. No cranial nerve deficit. Coordination normal.  Skin: Skin is warm and dry. No rash noted. She is not diaphoretic.  Psychiatric: She has a normal mood and affect. Her behavior is normal. Judgment and thought content normal.  Vitals reviewed.         Assessment & Plan:  Essential hypertension-stable with benazepril 20 mg daily  Hyperlipidemia-total cholesterol 226 with an LDL cholesterol of 127. This is about the same as it was a year ago.  Medicare wellness exam  Family history of colon cancer and father  Allergic rhinitis  History of glaucoma-treated with medication  Plan: Return in one year or as needed. She declines vitamin D testing. Continue to work on diet and exercise. Patient wants to stay off statin medication.  Subjective:   Patient presents for Medicare Annual/Subsequent preventive examination.  Review Past Medical/Family/Social:See above   Risk Factors  Current exercise habits: Active Dietary issues discussed: Low fat low carbohydrate  Cardiac risk factors: Hyperlipidemia  Depression Screen  (Note: if answer to either of the following is "Yes", a more complete depression screening is indicated)   Over the past two weeks, have you felt down, depressed or hopeless? No  Over the past two weeks, have you felt little interest or pleasure in doing things? No Have you lost interest or pleasure in  daily life? No Do you often feel hopeless? No Do you cry easily over simple problems? No   Activities of Daily Living  In your present state of health, do you have any difficulty performing the following activities?:   Driving? No  Managing money? No  Feeding yourself? No  Getting from bed to chair? No    Climbing a flight of stairs? No  Preparing food and eating?: No  Bathing or showering? No  Getting dressed: No  Getting to the toilet? No  Using the toilet:No  Moving around from place to place: No  In the past year have you fallen or had a near fall?:No  Are you sexually active? No  Do you have more than one partner? No   Hearing Difficulties: No  Do you often ask people to speak up or repeat themselves? Yes Do you experience ringing or noises in your ears? No  Do you have difficulty understanding soft or whispered voices? Yes Do you feel that you have a problem with memory? No Do you often misplace items? No    Home Safety:  Do you have a smoke alarm at your residence? Yes Do you have grab bars in the bathroom?No Do you have throw rugs in your house? No   Cognitive Testing  Alert? Yes Normal Appearance?Yes  Oriented to person? Yes Place? Yes  Time? Yes  Recall of three objects? Yes  Can perform simple calculations? Yes  Displays appropriate judgment?Yes  Can read the correct time from a watch face?Yes   List the Names of Other Physician/Practitioners you currently use:  See referral list for the physicians patient is currently seeing.     Review of Systems: See above   Objective:     General appearance: Appears stated age and mildly obese  Head: Normocephalic, without obvious abnormality, atraumatic  Eyes: conj clear, EOMi PEERLA  Ears: normal TM's and external ear canals both ears  Nose: Nares normal. Septum midline. Mucosa normal. No drainage or sinus tenderness.  Throat: lips, mucosa, and tongue normal; teeth and gums normal  Neck: no adenopathy, no carotid bruit, no JVD, supple, symmetrical, trachea midline and thyroid not enlarged, symmetric, no tenderness/mass/nodules  No CVA tenderness.  Lungs: clear to auscultation bilaterally  Breasts: normal appearance, no masses or tenderness Heart: regular rate and rhythm, S1, S2 normal, no murmur, click, rub or  gallop  Abdomen: soft, non-tender; bowel sounds normal; no masses, no organomegaly  Musculoskeletal: ROM normal in all joints, no crepitus, no deformity, Normal muscle strengthen. Back  is symmetric, no curvature. Skin: Skin color, texture, turgor normal. No rashes or lesions  Lymph nodes: Cervical, supraclavicular, and axillary nodes normal.  Neurologic: CN 2 -12 Normal, Normal symmetric reflexes. Normal coordination and gait  Psych: Alert & Oriented x 3, Mood appear stable.    Assessment:    Annual wellness medicare exam   Plan:    During the course of the visit the patient was educated and counseled about appropriate screening and preventive services including:   Annual flu vaccine  Annual mammogram     Patient Instructions (the written plan) was given to the patient.  Medicare Attestation  I have personally reviewed:  The patient's medical and social history  Their use of alcohol, tobacco or illicit drugs  Their current medications and supplements  The patient's functional ability including ADLs,fall risks, home safety risks, cognitive, and hearing and visual impairment  Diet and physical activities  Evidence for depression or mood disorders  The  patient's weight, height, BMI, and visual acuity have been recorded in the chart. I have made referrals, counseling, and provided education to the patient based on review of the above and I have provided the patient with a written personalized care plan for preventive services.

## 2017-04-28 MED FILL — LATANOPROST 0.005% EYE DRP: 0.005 | 75 days supply | Qty: 8 | Fill #1

## 2017-05-07 NOTE — Patient Instructions (Signed)
It was a pleasure to see you today. Patient wants to remain off of statin medication and will continue to watch diet and exercise. Continue antihypertensive medication and return in one year or as needed.

## 2017-05-30 DIAGNOSIS — L718 Other rosacea: Secondary | ICD-10-CM | POA: Diagnosis not present

## 2017-05-30 DIAGNOSIS — L218 Other seborrheic dermatitis: Secondary | ICD-10-CM | POA: Diagnosis not present

## 2017-05-30 DIAGNOSIS — D485 Neoplasm of uncertain behavior of skin: Secondary | ICD-10-CM | POA: Diagnosis not present

## 2017-05-30 DIAGNOSIS — D2271 Melanocytic nevi of right lower limb, including hip: Secondary | ICD-10-CM | POA: Diagnosis not present

## 2017-06-20 ENCOUNTER — Ambulatory Visit (INDEPENDENT_AMBULATORY_CARE_PROVIDER_SITE_OTHER): Payer: Medicare Other | Admitting: Internal Medicine

## 2017-06-20 VITALS — BP 118/78 | HR 73 | Temp 97.8°F

## 2017-06-20 DIAGNOSIS — Z23 Encounter for immunization: Secondary | ICD-10-CM

## 2017-06-20 DIAGNOSIS — I10 Essential (primary) hypertension: Secondary | ICD-10-CM

## 2017-06-20 MED ORDER — BENAZEPRIL-HYDROCHLOROTHIAZIDE 20-25 MG PO TABS: 1.0000 | ORAL_TABLET | Freq: Every day | ORAL | 3 refills | Status: DC

## 2017-06-20 NOTE — Patient Instructions (Signed)
Physician asked about med change in addition to flu vaccine given. This had been discussed previously and pt was content to take 2 pills until she changed insurance plans.Now wants combo pill

## 2017-06-20 NOTE — Progress Notes (Signed)
Pneumococcal 23 given today in the left arm. Pt tolerated well.   Pt wants med changed to Benazepril 20/25 daily as one pill and not 2 separate pills. This will be done, MJB

## 2017-07-14 ENCOUNTER — Ambulatory Visit (INDEPENDENT_AMBULATORY_CARE_PROVIDER_SITE_OTHER): Payer: Medicare Other | Admitting: Internal Medicine

## 2017-07-14 DIAGNOSIS — Z23 Encounter for immunization: Secondary | ICD-10-CM | POA: Diagnosis not present

## 2017-07-14 NOTE — Progress Notes (Signed)
Flu shot given

## 2017-07-14 NOTE — Patient Instructions (Signed)
Flu shot given

## 2017-07-25 ENCOUNTER — Telehealth: Payer: Self-pay

## 2017-07-25 MED ORDER — BENAZEPRIL-HYDROCHLOROTHIAZIDE 20-25 MG PO TABS: 1.0000 | ORAL_TABLET | Freq: Every day | ORAL | 3 refills | Status: DC

## 2017-07-25 MED FILL — HYDROCHLOROTHIAZIDE 25 MG T: 25 | 90 days supply | Qty: 90 | Fill #1

## 2017-07-25 MED FILL — BENAZEPRIL HCL 20 MG TABLET: 20 | 90 days supply | Qty: 90 | Fill #1

## 2017-07-25 NOTE — Telephone Encounter (Signed)
Resend medication to [facility]

## 2017-08-07 DIAGNOSIS — H353211 Exudative age-related macular degeneration, right eye, with active choroidal neovascularization: Secondary | ICD-10-CM | POA: Diagnosis not present

## 2017-08-07 DIAGNOSIS — H353122 Nonexudative age-related macular degeneration, left eye, intermediate dry stage: Secondary | ICD-10-CM | POA: Diagnosis not present

## 2017-08-07 DIAGNOSIS — H401111 Primary open-angle glaucoma, right eye, mild stage: Secondary | ICD-10-CM | POA: Diagnosis not present

## 2017-08-07 DIAGNOSIS — H401122 Primary open-angle glaucoma, left eye, moderate stage: Secondary | ICD-10-CM | POA: Diagnosis not present

## 2017-08-08 DIAGNOSIS — H43813 Vitreous degeneration, bilateral: Secondary | ICD-10-CM | POA: Diagnosis not present

## 2017-08-08 DIAGNOSIS — H35373 Puckering of macula, bilateral: Secondary | ICD-10-CM | POA: Diagnosis not present

## 2017-08-08 DIAGNOSIS — H353132 Nonexudative age-related macular degeneration, bilateral, intermediate dry stage: Secondary | ICD-10-CM | POA: Diagnosis not present

## 2017-08-08 DIAGNOSIS — D3131 Benign neoplasm of right choroid: Secondary | ICD-10-CM | POA: Diagnosis not present

## 2017-10-03 MED FILL — LATANOPROST 0.005% EYE DRP: 0.005 | 75 days supply | Qty: 8 | Fill #2

## 2017-10-23 MED FILL — BENAZEPRIL HCL 20 MG TABLET: 20 | 90 days supply | Qty: 90 | Fill #2

## 2017-10-23 MED FILL — HYDROCHLOROTHIAZIDE 25 MG T: 25 | 90 days supply | Qty: 90 | Fill #2

## 2018-01-18 MED FILL — BENAZEPRIL HCL 20 MG TABLET: 20 | 90 days supply | Qty: 90 | Fill #3

## 2018-01-18 MED FILL — HYDROCHLOROTHIAZIDE 25 MG T: 25 | 90 days supply | Qty: 90 | Fill #3

## 2018-02-06 MED FILL — LATANOPROST 0.005% EYE DRP: 0.005 | 75 days supply | Qty: 8 | Fill #0

## 2018-02-14 DIAGNOSIS — D3131 Benign neoplasm of right choroid: Secondary | ICD-10-CM | POA: Diagnosis not present

## 2018-02-14 DIAGNOSIS — H401111 Primary open-angle glaucoma, right eye, mild stage: Secondary | ICD-10-CM | POA: Diagnosis not present

## 2018-02-14 DIAGNOSIS — H353132 Nonexudative age-related macular degeneration, bilateral, intermediate dry stage: Secondary | ICD-10-CM | POA: Diagnosis not present

## 2018-02-14 DIAGNOSIS — H401122 Primary open-angle glaucoma, left eye, moderate stage: Secondary | ICD-10-CM | POA: Diagnosis not present

## 2018-04-12 ENCOUNTER — Other Ambulatory Visit: Payer: Self-pay | Admitting: Internal Medicine

## 2018-04-12 DIAGNOSIS — I10 Essential (primary) hypertension: Secondary | ICD-10-CM

## 2018-04-12 DIAGNOSIS — E785 Hyperlipidemia, unspecified: Secondary | ICD-10-CM

## 2018-04-12 DIAGNOSIS — Z0000 Encounter for general adult medical examination without abnormal findings: Secondary | ICD-10-CM

## 2018-04-17 ENCOUNTER — Other Ambulatory Visit: Payer: Medicare Other | Admitting: Internal Medicine

## 2018-04-17 DIAGNOSIS — E785 Hyperlipidemia, unspecified: Secondary | ICD-10-CM | POA: Diagnosis not present

## 2018-04-17 DIAGNOSIS — Z0000 Encounter for general adult medical examination without abnormal findings: Secondary | ICD-10-CM | POA: Diagnosis not present

## 2018-04-17 DIAGNOSIS — I10 Essential (primary) hypertension: Secondary | ICD-10-CM

## 2018-04-17 LAB — COMPLETE METABOLIC PANEL WITH GFR
AG Ratio: 1.7 (calc) (ref 1.0–2.5)
ALT: 13 U/L (ref 6–29)
AST: 14 U/L (ref 10–35)
Albumin: 4.2 g/dL (ref 3.6–5.1)
Alkaline phosphatase (APISO): 46 U/L (ref 33–130)
BUN: 13 mg/dL (ref 7–25)
CALCIUM: 9.4 mg/dL (ref 8.6–10.4)
CO2: 30 mmol/L (ref 20–32)
CREATININE: 0.77 mg/dL (ref 0.60–0.88)
Chloride: 99 mmol/L (ref 98–110)
GFR, EST NON AFRICAN AMERICAN: 72 mL/min/{1.73_m2} (ref >=60)
GFR, Est African American: 84 mL/min/{1.73_m2} (ref >=60)
Globulin: 2.5 g/dL (calc) (ref 1.9–3.7)
Glucose, Bld: 88 mg/dL (ref 65–99)
Potassium: 4 mmol/L (ref 3.5–5.3)
Sodium: 136 mmol/L (ref 135–146)
Total Bilirubin: 0.7 mg/dL (ref 0.2–1.2)
Total Protein: 6.7 g/dL (ref 6.1–8.1)

## 2018-04-17 LAB — CBC WITH DIFFERENTIAL/PLATELET
BASOS PCT: 1 %
Basophils Absolute: 49 cells/uL (ref 0–200)
EOS ABS: 181 {cells}/uL (ref 15–500)
Eosinophils Relative: 3.7 %
HCT: 43.3 % (ref 35.0–45.0)
HEMOGLOBIN: 14.8 g/dL (ref 11.7–15.5)
LYMPHS ABS: 1441 {cells}/uL (ref 850–3900)
MCH: 30.3 pg (ref 27.0–33.0)
MCHC: 34.2 g/dL (ref 32.0–36.0)
MCV: 88.5 fL (ref 80.0–100.0)
MONOS PCT: 12.5 %
MPV: 9.7 fL (ref 7.5–12.5)
NEUTROS ABS: 2617 {cells}/uL (ref 1500–7800)
Neutrophils Relative %: 53.4 %
Platelets: 286 10*3/uL (ref 140–400)
RBC: 4.89 10*6/uL (ref 3.80–5.10)
RDW: 12.1 % (ref 11.0–15.0)
Total Lymphocyte: 29.4 %
WBC: 4.9 10*3/uL (ref 3.8–10.8)
WBCMIX: 613 {cells}/uL (ref 200–950)

## 2018-04-17 LAB — LIPID PANEL
Cholesterol: 236 mg/dL — ABNORMAL HIGH (ref <200)
HDL: 71 mg/dL (ref >50)
LDL Cholesterol (Calc): 146 mg/dL (calc) — ABNORMAL HIGH
NON-HDL CHOLESTEROL (CALC): 165 mg/dL — AB (ref <130)
Total CHOL/HDL Ratio: 3.3 (calc) (ref <5.0)
Triglycerides: 85 mg/dL (ref <150)

## 2018-04-17 LAB — TSH: TSH: 2.85 m[IU]/L (ref 0.40–4.50)

## 2018-04-20 ENCOUNTER — Ambulatory Visit (INDEPENDENT_AMBULATORY_CARE_PROVIDER_SITE_OTHER): Payer: Medicare Other | Admitting: Internal Medicine

## 2018-04-20 ENCOUNTER — Other Ambulatory Visit: Payer: Self-pay | Admitting: Internal Medicine

## 2018-04-20 VITALS — BP 110/80 | HR 88 | Temp 98.8°F | Resp 12 | Ht 67.5 in | Wt 161.0 lb

## 2018-04-20 DIAGNOSIS — Z0000 Encounter for general adult medical examination without abnormal findings: Secondary | ICD-10-CM | POA: Diagnosis not present

## 2018-04-20 DIAGNOSIS — Z800 Family history of malignant neoplasm of digestive organs: Secondary | ICD-10-CM

## 2018-04-20 DIAGNOSIS — E7800 Pure hypercholesterolemia, unspecified: Secondary | ICD-10-CM

## 2018-04-20 DIAGNOSIS — I10 Essential (primary) hypertension: Secondary | ICD-10-CM | POA: Diagnosis not present

## 2018-04-20 DIAGNOSIS — Z1231 Encounter for screening mammogram for malignant neoplasm of breast: Secondary | ICD-10-CM

## 2018-04-20 MED ORDER — HYDROCHLOROTHIAZIDE 25 MG PO TABS: 25.0000 mg | ORAL_TABLET | Freq: Every day | ORAL | 3 refills | Status: DC

## 2018-04-20 MED ORDER — BENAZEPRIL HCL 20 MG PO TABS: 20.0000 mg | ORAL_TABLET | Freq: Every day | ORAL | 3 refills | Status: DC

## 2018-04-20 MED FILL — HYDROCHLOROTHIAZIDE 25 MG T: 25 | 90 days supply | Qty: 90 | Fill #0

## 2018-04-20 MED FILL — BENAZEPRIL HCL 20 MG TABLET: 20 | 90 days supply | Qty: 90 | Fill #0

## 2018-04-20 NOTE — Progress Notes (Signed)
Subjective:    Patient ID: Veronica Mcgrath, female    DOB: 1935/09/04, 83 y.o.   MRN: [id number]  HPI 83 year old Female for health maintenance exam, Medicare wellness and evaluation of medical issues.  History of glaucoma and allergic rhinitis.  She was tried on statin medication but did not like how she felt  and was concerned about side effects.  She does not want to restart it.  No known drug allergies  Past medical history: Status post cataract extraction both eyes around 2008.  Lumbar spine surgery 2010.  Colonoscopy 2011 by Dr. Watt Climes showed a hyperplastic polyp.  Gets annual flu vaccine.  Social history: She is retired.  She is a widow.  She has a for your college education.  Social alcohol consumption consisting of wine but does not consume it daily.  Does not smoke.  Family history: Father died of colon cancer at age 7.  Mother died of a stroke with history of diabetes mellitus at age 18.  One sister in good health.  3 adult sons.      Review of Systems  Constitutional: Negative.   All other systems reviewed and are negative.      Objective:   Physical Exam  Constitutional: She is oriented to person, place, and time. She appears well-developed and well-nourished.  HENT:  Head: Normocephalic and atraumatic.  Right Ear: External ear normal.  Left Ear: External ear normal.  Mouth/Throat: Oropharynx is clear and moist.  Eyes: Pupils are equal, round, and reactive to light. EOM are normal. Right eye exhibits no discharge. Left eye exhibits no discharge. No scleral icterus.  Neck: Neck supple. No JVD present. No tracheal deviation present. No thyromegaly present.  Cardiovascular: Normal rate, regular rhythm, normal heart sounds and intact distal pulses. Exam reveals no friction rub.  No murmur heard. Pulmonary/Chest: Effort normal and breath sounds normal. No stridor. No respiratory distress. She has no wheezes. She has no rales.  Abdominal: Soft. She exhibits no  distension and no mass. There is no tenderness. There is no rebound and no guarding.  Genitourinary:  Genitourinary Comments: Pap deferred due to age.  Bimanual normal.  Musculoskeletal: She exhibits no edema.  Lymphadenopathy:    She has no cervical adenopathy.  Neurological: She is alert and oriented to person, place, and time. She displays normal reflexes. No cranial nerve deficit. Coordination normal.  Skin: Skin is warm and dry.  Psychiatric: She has a normal mood and affect. Her behavior is normal. Judgment and thought content normal.  Vitals reviewed.         Assessment & Plan:  Essential hypertension-stable on current regimen  Family history of colon cancer in father who died at age 22.  Have patient call [facility] to see if repeat screening is appropriate with her family history  Hypercholesterolemia-does not want to be on statin therapy  Allergic rhinitis  History of glaucoma treated with medication  Plan: Return in 1 year or as needed.  She declines vitamin D testing.  She wants to stay off statin medication.  Subjective:   Patient presents for Medicare Annual/Subsequent preventive examination.  Review Past Medical/Family/Social: See above   Risk Factors  Current exercise habits: Very active about her home and yard Dietary issues discussed: Low-fat low carbohydrate  Cardiac risk factors: Mother died with stroke.  Patient has hyperlipidemia  Depression Screen  (Note: if answer to either of the following is "Yes", a more complete depression screening is indicated)   Over  the past two weeks, have you felt down, depressed or hopeless? No  Over the past two weeks, have you felt little interest or pleasure in doing things? No Have you lost interest or pleasure in daily life? No Do you often feel hopeless? No Do you cry easily over simple problems? No   Activities of Daily Living  In your present state of health, do you have any difficulty performing the  following activities?:   Driving? No  Managing money? No  Feeding yourself? No  Getting from bed to chair? No  Climbing a flight of stairs? No  Preparing food and eating?: No  Bathing or showering? No  Getting dressed: No  Getting to the toilet? No  Using the toilet:No  Moving around from place to place: No  In the past year have you fallen or had a near fall?:No  Are you sexually active? No  Do you have more than one partner? No   Hearing Difficulties:  Do you often ask people to speak up or repeat themselves?  Yes Do you experience ringing or noises in your ears? No  Do you have difficulty understanding soft or whispered voices?  Yes Do you feel that you have a problem with memory? No Do you often misplace items? No    Home Safety:  Do you have a smoke alarm at your residence? Yes Do you have grab bars in the bathroom?  No Do you have throw rugs in your house?  No   Cognitive Testing  Alert? Yes Normal Appearance?Yes  Oriented to person? Yes Place? Yes  Time? Yes  Recall of three objects? Yes  Can perform simple calculations? Yes  Displays appropriate judgment?Yes  Can read the correct time from a watch face?Yes   List the Names of Other Physician/Practitioners you currently use:  See referral list for the physicians patient is currently seeing.     Review of Systems: See above  Objective:     General appearance: Appears stated age and thin Head: Normocephalic, without obvious abnormality, atraumatic  Eyes: conj clear, EOMi PEERLA  Ears: normal TM's and external ear canals both ears  Nose: Nares normal. Septum midline. Mucosa normal. No drainage or sinus tenderness.  Throat: lips, mucosa, and tongue normal; teeth and gums normal  Neck: no adenopathy, no carotid bruit, no JVD, supple, symmetrical, trachea midline and thyroid not enlarged, symmetric, no tenderness/mass/nodules  No CVA tenderness.  Lungs: clear to auscultation bilaterally  Breasts: normal  appearance, no masses or tenderness Heart: regular rate and rhythm, S1, S2 normal, no murmur, click, rub or gallop  Abdomen: soft, non-tender; bowel sounds normal; no masses, no organomegaly  Musculoskeletal: ROM normal in all joints, no crepitus, no deformity, Normal muscle strengthen. Back  is symmetric, no curvature. Skin: Skin color, texture, turgor normal. No rashes or lesions  Lymph nodes: Cervical, supraclavicular, and axillary nodes normal.  Neurologic: CN 2 -12 Normal, Normal symmetric reflexes. Normal coordination and gait  Psych: Alert & Oriented x 3, Mood appear stable.    Assessment:    Annual wellness medicare exam   Plan:    During the course of the visit the patient was educated and counseled about appropriate screening and preventive services including:        Patient Instructions (the written plan) was given to the patient.  Medicare Attestation  I have personally reviewed:  The patient's medical and social history  Their use of alcohol, tobacco or illicit drugs  Their current medications and supplements  The patient's functional ability including ADLs,fall risks, home safety risks, cognitive, and hearing and visual impairment  Diet and physical activities  Evidence for depression or mood disorders  The patient's weight, height, BMI, and visual acuity have been recorded in the chart. I have made referrals, counseling, and provided education to the patient based on review of the above and I have provided the patient with a written personalized care plan for preventive services.

## 2018-05-01 DIAGNOSIS — H353132 Nonexudative age-related macular degeneration, bilateral, intermediate dry stage: Secondary | ICD-10-CM | POA: Diagnosis not present

## 2018-05-01 DIAGNOSIS — H401111 Primary open-angle glaucoma, right eye, mild stage: Secondary | ICD-10-CM | POA: Diagnosis not present

## 2018-05-01 DIAGNOSIS — H401122 Primary open-angle glaucoma, left eye, moderate stage: Secondary | ICD-10-CM | POA: Diagnosis not present

## 2018-05-09 ENCOUNTER — Encounter: Payer: Self-pay | Admitting: Internal Medicine

## 2018-05-09 NOTE — Patient Instructions (Signed)
Was a pleasure to see you today.  Continue same medications and return in 1 year or as needed.  Please call [facility] to see if it is appropriate for you to have repeat colonoscopy with family history of father passing away in his 71s from colon cancer.

## 2018-05-25 ENCOUNTER — Ambulatory Visit
Admission: RE | Admit: 2018-05-25 | Discharge: 2018-05-25 | Disposition: A | Discharge status: Home / Self Care | Payer: Medicare Other | Source: Ambulatory Visit | Attending: Internal Medicine | Admitting: Internal Medicine

## 2018-05-25 DIAGNOSIS — Z1231 Encounter for screening mammogram for malignant neoplasm of breast: Secondary | ICD-10-CM

## 2018-06-28 ENCOUNTER — Ambulatory Visit (INDEPENDENT_AMBULATORY_CARE_PROVIDER_SITE_OTHER): Payer: Medicare Other | Admitting: Internal Medicine

## 2018-06-28 DIAGNOSIS — Z23 Encounter for immunization: Secondary | ICD-10-CM

## 2018-06-28 NOTE — Patient Instructions (Signed)
Patient received a flu vaccine IM L deltoid, AV, CMA  

## 2018-07-20 MED FILL — HYDROCHLOROTHIAZIDE 25 MG T: 25 | 90 days supply | Qty: 90 | Fill #1

## 2018-07-20 MED FILL — LATANOPROST 0.005% EYE DRP: 0.005 | 75 days supply | Qty: 8 | Fill #1

## 2018-07-20 MED FILL — BENAZEPRIL HCL 20 MG TABLET: 20 | 90 days supply | Qty: 90 | Fill #1

## 2018-08-14 DIAGNOSIS — H43813 Vitreous degeneration, bilateral: Secondary | ICD-10-CM | POA: Diagnosis not present

## 2018-08-14 DIAGNOSIS — H35373 Puckering of macula, bilateral: Secondary | ICD-10-CM | POA: Diagnosis not present

## 2018-08-14 DIAGNOSIS — D3131 Benign neoplasm of right choroid: Secondary | ICD-10-CM | POA: Diagnosis not present

## 2018-08-14 DIAGNOSIS — H353132 Nonexudative age-related macular degeneration, bilateral, intermediate dry stage: Secondary | ICD-10-CM | POA: Diagnosis not present

## 2018-09-07 DIAGNOSIS — H401111 Primary open-angle glaucoma, right eye, mild stage: Secondary | ICD-10-CM | POA: Diagnosis not present

## 2018-09-07 DIAGNOSIS — H401122 Primary open-angle glaucoma, left eye, moderate stage: Secondary | ICD-10-CM | POA: Diagnosis not present

## 2018-10-19 MED FILL — BENAZEPRIL HCL 20 MG TABLET: 20 | 90 days supply | Qty: 90 | Fill #2

## 2018-10-19 MED FILL — HYDROCHLOROTHIAZIDE 25 MG T: 25 | 90 days supply | Qty: 90 | Fill #2

## 2019-01-09 DIAGNOSIS — D3131 Benign neoplasm of right choroid: Secondary | ICD-10-CM | POA: Diagnosis not present

## 2019-01-09 DIAGNOSIS — H353132 Nonexudative age-related macular degeneration, bilateral, intermediate dry stage: Secondary | ICD-10-CM | POA: Diagnosis not present

## 2019-01-09 DIAGNOSIS — H401122 Primary open-angle glaucoma, left eye, moderate stage: Secondary | ICD-10-CM | POA: Diagnosis not present

## 2019-01-09 MED FILL — LATANOPROST 0.005% EYE DRP: 0.005 | 75 days supply | Qty: 8 | Fill #0

## 2019-01-14 MED FILL — BENAZEPRIL HCL 20 MG TABLET: 20 | 90 days supply | Qty: 90 | Fill #3

## 2019-01-14 MED FILL — HYDROCHLOROTHIAZIDE 25 MG T: 25 | 90 days supply | Qty: 90 | Fill #3

## 2019-02-15 MED FILL — LATANOPROST 0.005% EYE DRP: 0.005 | 75 days supply | Qty: 8 | Fill #0

## 2019-04-17 ENCOUNTER — Other Ambulatory Visit: Payer: Self-pay | Admitting: Internal Medicine

## 2019-04-17 MED FILL — BENAZEPRIL HCL 20 MG TABLET: 20 | 90 days supply | Qty: 90 | Fill #0

## 2019-04-17 MED FILL — HYDROCHLOROTHIAZIDE 25 MG T: 25 | 90 days supply | Qty: 90 | Fill #0

## 2019-04-23 ENCOUNTER — Other Ambulatory Visit: Payer: Self-pay

## 2019-04-23 ENCOUNTER — Other Ambulatory Visit: Payer: Medicare Other | Admitting: Internal Medicine

## 2019-04-23 DIAGNOSIS — I10 Essential (primary) hypertension: Secondary | ICD-10-CM | POA: Diagnosis not present

## 2019-04-23 DIAGNOSIS — Z0000 Encounter for general adult medical examination without abnormal findings: Secondary | ICD-10-CM | POA: Diagnosis not present

## 2019-04-23 DIAGNOSIS — Z800 Family history of malignant neoplasm of digestive organs: Secondary | ICD-10-CM | POA: Diagnosis not present

## 2019-04-23 DIAGNOSIS — E7800 Pure hypercholesterolemia, unspecified: Secondary | ICD-10-CM

## 2019-04-23 LAB — COMPLETE METABOLIC PANEL WITH GFR
AG Ratio: 1.8 (calc) (ref 1.0–2.5)
ALT: 14 U/L (ref 6–29)
AST: 15 U/L (ref 10–35)
Albumin: 4.3 g/dL (ref 3.6–5.1)
Alkaline phosphatase (APISO): 42 U/L (ref 37–153)
BUN: 13 mg/dL (ref 7–25)
CO2: 29 mmol/L (ref 20–32)
Calcium: 9.8 mg/dL (ref 8.6–10.4)
Chloride: 97 mmol/L — ABNORMAL LOW (ref 98–110)
Creat: 0.86 mg/dL (ref 0.60–0.88)
GFR, Est African American: 73 mL/min/{1.73_m2} (ref >=60)
GFR, Est Non African American: 63 mL/min/{1.73_m2} (ref >=60)
Globulin: 2.4 g/dL (calc) (ref 1.9–3.7)
Glucose, Bld: 88 mg/dL (ref 65–99)
Potassium: 3.9 mmol/L (ref 3.5–5.3)
Sodium: 135 mmol/L (ref 135–146)
Total Bilirubin: 0.8 mg/dL (ref 0.2–1.2)
Total Protein: 6.7 g/dL (ref 6.1–8.1)

## 2019-04-23 LAB — CBC WITH DIFFERENTIAL/PLATELET
Absolute Monocytes: 701 cells/uL (ref 200–950)
Basophils Absolute: 29 cells/uL (ref 0–200)
Basophils Relative: 0.4 %
Eosinophils Absolute: 102 cells/uL (ref 15–500)
Eosinophils Relative: 1.4 %
HCT: 47 % — ABNORMAL HIGH (ref 35.0–45.0)
Hemoglobin: 15.4 g/dL (ref 11.7–15.5)
Lymphs Abs: 1467 cells/uL (ref 850–3900)
MCH: 30.1 pg (ref 27.0–33.0)
MCHC: 32.8 g/dL (ref 32.0–36.0)
MCV: 91.8 fL (ref 80.0–100.0)
MPV: 9.8 fL (ref 7.5–12.5)
Monocytes Relative: 9.6 %
Neutro Abs: 5001 cells/uL (ref 1500–7800)
Neutrophils Relative %: 68.5 %
Platelets: 288 10*3/uL (ref 140–400)
RBC: 5.12 10*6/uL — ABNORMAL HIGH (ref 3.80–5.10)
RDW: 11.9 % (ref 11.0–15.0)
Total Lymphocyte: 20.1 %
WBC: 7.3 10*3/uL (ref 3.8–10.8)

## 2019-04-23 LAB — TSH: TSH: 1.73 mIU/L (ref 0.40–4.50)

## 2019-04-23 LAB — LIPID PANEL
Cholesterol: 217 mg/dL — ABNORMAL HIGH (ref <200)
HDL: 72 mg/dL (ref >=50)
LDL Cholesterol (Calc): 128 mg/dL (calc) — ABNORMAL HIGH
Non-HDL Cholesterol (Calc): 145 mg/dL (calc) — ABNORMAL HIGH (ref <130)
Total CHOL/HDL Ratio: 3 (calc) (ref <5.0)
Triglycerides: 74 mg/dL (ref <150)

## 2019-04-25 ENCOUNTER — Ambulatory Visit (INDEPENDENT_AMBULATORY_CARE_PROVIDER_SITE_OTHER): Payer: Medicare Other | Admitting: Internal Medicine

## 2019-04-25 ENCOUNTER — Encounter: Payer: Self-pay | Admitting: Internal Medicine

## 2019-04-25 ENCOUNTER — Other Ambulatory Visit: Payer: Self-pay

## 2019-04-25 VITALS — BP 120/90 | HR 80 | Temp 98.2°F | Ht 68.0 in | Wt 153.0 lb

## 2019-04-25 DIAGNOSIS — H3530 Unspecified macular degeneration: Secondary | ICD-10-CM

## 2019-04-25 DIAGNOSIS — Z0000 Encounter for general adult medical examination without abnormal findings: Secondary | ICD-10-CM | POA: Diagnosis not present

## 2019-04-25 DIAGNOSIS — I10 Essential (primary) hypertension: Secondary | ICD-10-CM | POA: Diagnosis not present

## 2019-04-25 DIAGNOSIS — K644 Residual hemorrhoidal skin tags: Secondary | ICD-10-CM

## 2019-04-25 DIAGNOSIS — Z800 Family history of malignant neoplasm of digestive organs: Secondary | ICD-10-CM | POA: Diagnosis not present

## 2019-04-25 DIAGNOSIS — E7800 Pure hypercholesterolemia, unspecified: Secondary | ICD-10-CM

## 2019-04-25 MED ORDER — HYDROCORTISONE (PERIANAL) 2.5 % EX CREA: 1.0000 "application " | TOPICAL_CREAM | Freq: Two times a day (BID) | CUTANEOUS | 0 refills | Status: DC

## 2019-04-25 MED FILL — PROCTOZONE-HC 2.5 % CREA: 2.5 | 15 days supply | Qty: 30 | Fill #0

## 2019-04-25 NOTE — Patient Instructions (Signed)
Continue to monitor BP at home. Watch diet. Pt does not want statin med. Needs flu vaccine in 2 weeks. RTC one year. Anusol for hemorrhoids.

## 2019-04-25 NOTE — Progress Notes (Signed)
Subjective:    Patient ID: Veronica Mcgrath, female    DOB: Sep 05, 1935, 84 y.o.   MRN: [id number]  HPI 84 year old Female for health maintenance exam, Medicare wellness visit and evaluation of medical issues.  Her general health is excellent.  She has a history of hyperlipidemia and essential hypertension.  History of glaucoma and allergic rhinitis.  Tried statin medication but did not like how she felt on it was concerned about side effects.  Does not want to be on it.  No known drug allergies.  Has been diagnosed with macular degeneration in both eyes  Gets annual flu vaccine.  Colonoscopy 2011 by Dr. Watt Climes showed a hyperplastic polyp.  Past medical history: Status post cataract extraction both eyes around 2008.  Lumbar spine surgery 2010.  Social history: She is retired.  She is a widow.  She has a Midwife.  Social alcohol consumption consisting of wine but does not consume it daily.  Approximately.  Family history: Father died of colon cancer at age 62.  Mother died of a stroke with history of diabetes mellitus at age 46.  One sister in good health.  3 adult sons.    Review of Systems  Constitutional: Negative.   All other systems reviewed and are negative.      Objective:   Physical Exam Blood pressure 120/90, pulse 80, temperature 98.2 weight 153 pounds.  BMI 23.26  Skin warm and dry.  Nodes none.  TMs and pharynx are clear.  Neck is supple.  No JVD thyromegaly or carotid bruits.  Chest clear to auscultation.  Cardiac exam regular rate and rhythm.  Breasts: normal female without masses Abdomen: No hepatosplenomegaly masses or tenderness.  GYN exam deferred due to age.  No lower extremity edema.  Neuro no focal deficits.  Affect is normal and judgment appears to be normal.      Assessment & Plan:  Essential hypertension-stable on current regimen of benazepril 20 mg daily and HCTZ 25 mg daily  Family history of colon cancer in father who died at age 40.   Consider Cologuard if she is not willing to have repeat colonoscopy or if gastroenterologist does not think it is appropriate for her age.  Hypercholesterolemia-does not want to be on statin medication.  Total cholesterol 217 and LDL cholesterol 128.  HDL is 72 and triglycerides are 74.  History of glaucoma-treated with Xalatan  Allergic rhinitis  Hemorrhoids and perirectal irritation.  Prescribed Anusol 2.5% rectal cream to use twice daily as needed  Plan: She will monitor her blood pressure at home and return in 1 year or as needed.  Subjective:   Patient presents for Medicare Annual/Subsequent preventive examination.  Review Past Medical/Family/Social: See above   Risk Factors  Current exercise habits: Exercises regularly and watches weight Dietary issues discussed: Low-fat low carbohydrate  Cardiac risk factors: Hyperlipidemia and family history  Depression Screen  (Note: if answer to either of the following is "Yes", a more complete depression screening is indicated)   Over the past two weeks, have you felt down, depressed or hopeless? No  Over the past two weeks, have you felt little interest or pleasure in doing things? No Have you lost interest or pleasure in daily life? No Do you often feel hopeless? No Do you cry easily over simple problems? No   Activities of Daily Living  In your present state of health, do you have any difficulty performing the following activities?:   Driving? No  Managing money? No  Feeding yourself? No  Getting from bed to chair? No  Climbing a flight of stairs? No  Preparing food and eating?: No  Bathing or showering? No  Getting dressed: No  Getting to the toilet? No  Using the toilet:No  Moving around from place to place: No  In the past year have you fallen or had a near fall?:No  Are you sexually active? No  Do you have more than one partner? No   Hearing Difficulties: No  Do you often ask people to speak up or repeat  themselves?  Yes Do you experience ringing or noises in your ears? No  Do you have difficulty understanding soft or whispered voices?  Yes Do you feel that you have a problem with memory? No Do you often misplace items? No    Home Safety:  Do you have a smoke alarm at your residence? Yes Do you have grab bars in the bathroom?  Yes Do you have throw rugs in your house?  None   Cognitive Testing  Alert? Yes Normal Appearance?Yes  Oriented to person? Yes Place? Yes  Time? Yes  Recall of three objects? Yes  Can perform simple calculations? Yes  Displays appropriate judgment?Yes  Can read the correct time from a watch face?Yes   List the Names of Other Physician/Practitioners you currently use:  See referral list for the physicians patient is currently seeing.     Review of Systems: See above   Objective:     General appearance: Appears stated age and thin Head: Normocephalic, without obvious abnormality, atraumatic  Eyes: conj clear, EOMi PEERLA  Ears: normal TM's and external ear canals both ears  Nose: Nares normal. Septum midline. Mucosa normal. No drainage or sinus tenderness.  Throat: lips, mucosa, and tongue normal; teeth and gums normal  Neck: no adenopathy, no carotid bruit, no JVD, supple, symmetrical, trachea midline and thyroid not enlarged, symmetric, no tenderness/mass/nodules  No CVA tenderness.  Lungs: clear to auscultation bilaterally  Breasts: normal appearance, no masses or tenderness Heart: regular rate and rhythm, S1, S2 normal, no murmur, click, rub or gallop  Abdomen: soft, non-tender; bowel sounds normal; no masses, no organomegaly  Musculoskeletal: ROM normal in all joints, no crepitus, no deformity, Normal muscle strengthen. Back  is symmetric, no curvature. Skin: Skin color, texture, turgor normal. No rashes or lesions  Lymph nodes: Cervical, supraclavicular, and axillary nodes normal.  Neurologic: CN 2 -12 Normal, Normal symmetric reflexes.  Normal coordination and gait  Psych: Alert & Oriented x 3, Mood appear stable.    Assessment:    Annual wellness medicare exam   Plan:    During the course of the visit the patient was educated and counseled about appropriate screening and preventive services including:   Recommend annual flu vaccine     Patient Instructions (the written plan) was given to the patient.  Medicare Attestation  I have personally reviewed:  The patient's medical and social history  Their use of alcohol, tobacco or illicit drugs  Their current medications and supplements  The patient's functional ability including ADLs,fall risks, home safety risks, cognitive, and hearing and visual impairment  Diet and physical activities  Evidence for depression or mood disorders  The patient's weight, height, BMI, and visual acuity have been recorded in the chart. I have made referrals, counseling, and provided education to the patient based on review of the above and I have provided the patient with a written personalized care plan for preventive  services.

## 2019-05-12 ENCOUNTER — Encounter: Payer: Self-pay | Admitting: Internal Medicine

## 2019-05-14 DIAGNOSIS — Z961 Presence of intraocular lens: Secondary | ICD-10-CM | POA: Diagnosis not present

## 2019-05-14 DIAGNOSIS — H401111 Primary open-angle glaucoma, right eye, mild stage: Secondary | ICD-10-CM | POA: Diagnosis not present

## 2019-05-14 DIAGNOSIS — H401122 Primary open-angle glaucoma, left eye, moderate stage: Secondary | ICD-10-CM | POA: Diagnosis not present

## 2019-05-14 DIAGNOSIS — H1789 Other corneal scars and opacities: Secondary | ICD-10-CM | POA: Diagnosis not present

## 2019-05-16 ENCOUNTER — Ambulatory Visit (INDEPENDENT_AMBULATORY_CARE_PROVIDER_SITE_OTHER): Payer: Medicare Other | Admitting: Internal Medicine

## 2019-05-16 ENCOUNTER — Encounter: Payer: Self-pay | Admitting: Internal Medicine

## 2019-05-16 ENCOUNTER — Other Ambulatory Visit: Payer: Self-pay

## 2019-05-16 DIAGNOSIS — Z23 Encounter for immunization: Secondary | ICD-10-CM

## 2019-05-16 NOTE — Patient Instructions (Signed)
Patient received a flu vaccine IM L deltoid, AV, CMA  

## 2019-05-16 NOTE — Progress Notes (Signed)
Flu vaccine given by CMA 

## 2019-05-24 ENCOUNTER — Other Ambulatory Visit: Payer: BLUE CROSS/BLUE SHIELD | Admitting: Internal Medicine

## 2019-06-20 DIAGNOSIS — H401122 Primary open-angle glaucoma, left eye, moderate stage: Secondary | ICD-10-CM | POA: Diagnosis not present

## 2019-06-20 DIAGNOSIS — H401111 Primary open-angle glaucoma, right eye, mild stage: Secondary | ICD-10-CM | POA: Diagnosis not present

## 2019-07-10 DIAGNOSIS — H401122 Primary open-angle glaucoma, left eye, moderate stage: Secondary | ICD-10-CM | POA: Diagnosis not present

## 2019-07-10 MED FILL — PREDNISOLONE AC 1% EYE DROP: 1 | 25 days supply | Qty: 5 | Fill #0

## 2019-07-17 DIAGNOSIS — H401112 Primary open-angle glaucoma, right eye, moderate stage: Secondary | ICD-10-CM | POA: Diagnosis not present

## 2019-07-19 ENCOUNTER — Other Ambulatory Visit: Payer: Self-pay | Admitting: Internal Medicine

## 2019-07-19 MED FILL — BENAZEPRIL HCL 20 MG TABLET: 20 | 90 days supply | Qty: 90 | Fill #0

## 2019-07-19 MED FILL — HYDROCHLOROTHIAZIDE 25 MG T: 25 | 90 days supply | Qty: 90 | Fill #0

## 2019-08-23 DIAGNOSIS — H401122 Primary open-angle glaucoma, left eye, moderate stage: Secondary | ICD-10-CM | POA: Diagnosis not present

## 2019-08-23 DIAGNOSIS — H401111 Primary open-angle glaucoma, right eye, mild stage: Secondary | ICD-10-CM | POA: Diagnosis not present

## 2019-08-23 MED FILL — TIMOLOL 0.5% EYE DROPS: 0.5 | 30 days supply | Qty: 5 | Fill #0

## 2019-09-19 ENCOUNTER — Telehealth: Payer: Self-pay | Admitting: Internal Medicine

## 2019-09-19 ENCOUNTER — Other Ambulatory Visit: Payer: Self-pay

## 2019-09-19 NOTE — Telephone Encounter (Signed)
Her TSH was normal in August. Yes, let's look at records from opthalmologist

## 2019-09-19 NOTE — Telephone Encounter (Signed)
Breklyn Bertelli [phone]  Adela Lank called to say she went to [facility] and Dr sanders suspects that she has Thyroid Eye disease. I ask her to have [facility] to fax over office notes form visit so that Dr Renold Genta could review, then advise or see patient.

## 2019-09-23 NOTE — Telephone Encounter (Signed)
Notes came from [facility] scheduled appointment to discuss.

## 2019-09-26 ENCOUNTER — Encounter: Payer: Self-pay | Admitting: Internal Medicine

## 2019-09-26 ENCOUNTER — Other Ambulatory Visit: Payer: Self-pay

## 2019-09-26 ENCOUNTER — Ambulatory Visit (INDEPENDENT_AMBULATORY_CARE_PROVIDER_SITE_OTHER): Payer: Medicare Other | Admitting: Internal Medicine

## 2019-09-26 VITALS — BP 100/70 | HR 69 | Ht 68.0 in | Wt 157.0 lb

## 2019-09-26 DIAGNOSIS — H3530 Unspecified macular degeneration: Secondary | ICD-10-CM | POA: Diagnosis not present

## 2019-09-26 DIAGNOSIS — I10 Essential (primary) hypertension: Secondary | ICD-10-CM

## 2019-09-26 DIAGNOSIS — E7800 Pure hypercholesterolemia, unspecified: Secondary | ICD-10-CM

## 2019-09-26 DIAGNOSIS — E0590 Thyrotoxicosis, unspecified without thyrotoxic crisis or storm: Secondary | ICD-10-CM | POA: Diagnosis not present

## 2019-09-26 NOTE — Patient Instructions (Addendum)
Refer to Endocrinology regarding whether or not patient has thyroiditis disease.  Her thyroid labs are normal.

## 2019-09-26 NOTE — Progress Notes (Signed)
   Subjective:    Patient ID: Veronica Mcgrath, female    DOB: 1934/10/11, 85 y.o.   MRN: [id number]  HPI 85 year old Female seen today because eye physician was concerned she may have thyroid eye disease.  She has macular degeneration, hypertension, glaucoma, and hypercholesterolemia.  She was seen by [facility], Dr. Baird Cancer upon referral by her ophthalmologist, Dr. Ellie Lunch.  Glaucoma is treated with atenolol.  Hypertension treated with benazepril with HCTZ.  She is on aspirin 81 mg daily.  Review of Dr. Baird Cancer note indicates he mentioned and he is planned that she possibly had thyroid disease contributing to dry eye.  He also noted that glaucoma eyedrops could cause eye irritation.  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  Patient called the office morning to discuss possibility of thyroid eye disease with me.  Review of Systems has appt for Covid 19 vaccine     Objective:   Physical Exam She seems anxious about her vision issues.  She has no thyromegaly.  Her vital signs are stable and reviewed.  Today we drew TSH, free T4, free T3, thyroid peroxidase antibodies all of which are normal.       Assessment & Plan:  Macular degeneration  Glaucoma  Decreased vision  Retina specialist raised possibility of thyroid disease  Plan: Patient will be referred to endocrinologist to rule this out.  She is anxious about her vision issues today and would like specialist opinion regarding possible thyroid disease.

## 2019-09-27 LAB — T4, FREE: Free T4: 1.3 ng/dL (ref 0.8–1.8)

## 2019-09-27 LAB — THYROID PEROXIDASE ANTIBODY: Thyroperoxidase Ab SerPl-aCnc: <1 IU/mL (ref <9)

## 2019-09-27 LAB — TSH: TSH: 2.84 mIU/L (ref 0.40–4.50)

## 2019-09-27 LAB — T3, FREE: T3, Free: 3 pg/mL (ref 2.3–4.2)

## 2019-10-02 ENCOUNTER — Ambulatory Visit: Payer: Medicare Other | Attending: Internal Medicine

## 2019-10-02 DIAGNOSIS — Z23 Encounter for immunization: Secondary | ICD-10-CM

## 2019-10-02 NOTE — Progress Notes (Signed)
   Covid-19 Vaccination Clinic  Name:  Veronica Mcgrath    MRN: [id number] DOB: 06-14-1935  10/02/2019  Veronica Mcgrath was observed post Covid-19 immunization for 15 minutes without incidence. She was provided with Vaccine Information Sheet and instruction to access the V-Safe system.   Veronica Mcgrath was instructed to call 911 with any severe reactions post vaccine: Marland Kitchen Difficulty breathing  . Swelling of your face and throat  . A fast heartbeat  . A bad rash all over your body  . Dizziness and weakness    Immunizations Administered    Name Date Dose VIS Date Route   Pfizer COVID-19 Vaccine 10/02/2019  9:03 AM 0.3 mL 08/23/2019 Intramuscular   Manufacturer: [place]   Lot: [id number]   [place]: [id number]

## 2019-10-14 MED FILL — TIMOLOL 0.5% EYE DROPS: 0.5 | 30 days supply | Qty: 5 | Fill #1

## 2019-10-16 ENCOUNTER — Other Ambulatory Visit: Payer: Self-pay | Admitting: Internal Medicine

## 2019-10-16 MED FILL — HYDROCHLOROTHIAZIDE 25 MG T: 25 | 90 days supply | Qty: 90 | Fill #1

## 2019-10-16 MED FILL — BENAZEPRIL HCL 20 MG TABLET: 20 | 90 days supply | Qty: 90 | Fill #0

## 2019-10-17 ENCOUNTER — Other Ambulatory Visit: Payer: Self-pay

## 2019-10-21 ENCOUNTER — Other Ambulatory Visit: Payer: Self-pay

## 2019-10-21 ENCOUNTER — Ambulatory Visit (INDEPENDENT_AMBULATORY_CARE_PROVIDER_SITE_OTHER): Payer: Medicare Other | Admitting: Endocrinology

## 2019-10-21 ENCOUNTER — Encounter: Payer: Self-pay | Admitting: Endocrinology

## 2019-10-21 DIAGNOSIS — H579 Unspecified disorder of eye and adnexa: Secondary | ICD-10-CM | POA: Diagnosis not present

## 2019-10-21 NOTE — Patient Instructions (Addendum)
We could do the MRI of the eyes, but it would not change anything we would do. Because of the opinion of Dr Baird Cancer, you should have the thyroid levels checked 1-2 times per year.   I would be happy to see you back here as needed.

## 2019-10-21 NOTE — Progress Notes (Signed)
Subjective:    Patient ID: Veronica Mcgrath, female    DOB: [birth year], 85 y.o.   MRN: [id number]  HPI Pt is ref by Dr Renold Genta, for possible thyroid eye disease.  This was suggested 1/21, by her opthal.  She has never been dx'ed with thyroid dz.  She recently had laser rx for glaucoma.  opthal suspected thyroid eye dz.  She has chronic blurry vision and eye itching.   Past Medical History:  Diagnosis Date  . Glaucoma   . Hyperlipidemia   . Hypertension     Past Surgical History:  Procedure Laterality Date  . LUMBAR [place] SURGERY  2010    Social History   Socioeconomic History  . Marital status: Widowed    Spouse name: Not on file  . Number of children: Not on file  . Years of education: Not on file  . Highest education level: Not on file  Occupational History  . Not on file  Tobacco Use  . Smoking status: Never Smoker  . Smokeless tobacco: Never Used  Substance and Sexual Activity  . Alcohol use: Not on file  . Drug use: Not on file  . Sexual activity: Not on file  Other Topics Concern  . Not on file  Social History Narrative  . Not on file   Social Determinants of Health   Financial Resource Strain:   . Difficulty of Paying Living Expenses: Not on file  Food Insecurity:   . Worried About Charity fundraiser in the Last Year: Not on file  . Ran Out of Food in the Last Year: Not on file  Transportation Needs:   . Lack of Transportation (Medical): Not on file  . Lack of Transportation (Non-Medical): Not on file  Physical Activity:   . Days of Exercise per Week: Not on file  . Minutes of Exercise per Session: Not on file  Stress:   . Feeling of Stress : Not on file  Social Connections:   . Frequency of Communication with Friends and Family: Not on file  . Frequency of Social Gatherings with Friends and Family: Not on file  . Attends Religious Services: Not on file  . Active Member of Clubs or Organizations: Not on file  . Attends Archivist  Meetings: Not on file  . Marital Status: Not on file  Intimate Partner Violence:   . Fear of Current or Ex-Partner: Not on file  . Emotionally Abused: Not on file  . Physically Abused: Not on file  . Sexually Abused: Not on file    Current Outpatient Medications on File Prior to Visit  Medication Sig Dispense Refill  . aspirin 81 MG tablet Take 81 mg by mouth daily.    . benazepril (LOTENSIN) 20 MG tablet TAKE 1 TABLET BY MOUTH DAILY. 90 tablet 0  . Cholecalciferol (VITAMIN D PO) Take 2,000 tablets by mouth daily.     . hydrochlorothiazide (HYDRODIURIL) 25 MG tablet TAKE 1 TABLET BY MOUTH DAILY. 90 tablet 2  . timolol (TIMOPTIC) 0.5 % ophthalmic solution Place 1 drop into both eyes every morning.     No current facility-administered medications on file prior to visit.    No Known Allergies  Family History  Problem Relation Age of Onset  . Diabetes Mother   . Stroke Mother   . Cancer Father   . Thyroid disease Neg Hx     BP 128/80 (BP Location: Left Arm, Patient Position: Sitting, Cuff Size: Normal)   Pulse  85   Ht 5\' 8"  (1.727 m)   Wt 159 lb 6.4 oz (72.3 kg)   SpO2 95%   BMI 24.24 kg/m    Review of Systems She has chronic nasal congestion.  Denies weight change, diplopia, sob, arthralgias, rash, and fever.     Objective:    VS: see vs page GEN: no distress HEAD: head: no deformity eyes: no periorbital swelling, no proptosis.  Slight bilat conjunctival injection.   external nose and ears are normal NECK: supple, thyroid is not enlarged CHEST WALL: no deformity LUNGS: clear to auscultation CV: reg rate and rhythm, no murmur MUSCULOSKELETAL: muscle bulk and strength are grossly normal.  no joint swelling of the hands.  gait is normal and steady EXTEMITIES: no deformity.  no leg edema.  PULSES: no carotid bruit NEURO:  cn 2-12 grossly intact.   readily moves all 4's.  sensation is intact to touch on all 4's SKIN:  Normal texture and temperature.  No rash or  suspicious lesion is visible.   NODES:  None palpable at the neck PSYCH: alert, well-oriented.  Does not appear anxious nor depressed.  I have reviewed outside records, and summarized: Pt was noted to have dry eyes, and thyroid eye disease was felt to be a possible contributor  TPO antibody: new  Lab Results  Component Value Date   TSH 2.84 09/26/2019      Assessment & Plan:  eye disorder, of uncertain type, new to me.  We discussed.   Patient Instructions  We could do the MRI of the eyes, but it would not change anything we would do. Because of the opinion of Dr Baird Cancer, you should have the thyroid levels checked 1-2 times per year.   I would be happy to see you back here as needed.

## 2019-10-22 DIAGNOSIS — H579 Unspecified disorder of eye and adnexa: Secondary | ICD-10-CM | POA: Insufficient documentation

## 2019-10-23 ENCOUNTER — Ambulatory Visit: Payer: Medicare Other | Attending: Internal Medicine

## 2019-10-23 DIAGNOSIS — Z23 Encounter for immunization: Secondary | ICD-10-CM

## 2019-10-23 NOTE — Progress Notes (Signed)
Thanks

## 2019-10-23 NOTE — Progress Notes (Signed)
   Covid-19 Vaccination Clinic  Name:  Veronica Mcgrath    MRN: [id number] DOB: 08-15-1935  10/23/2019  Ms. Veronica Mcgrath was observed post Covid-19 immunization for 15 minutes without incidence. She was provided with Vaccine Information Sheet and instruction to access the V-Safe system.   Ms. Veronica Mcgrath was instructed to call 911 with any severe reactions post vaccine: Marland Kitchen Difficulty breathing  . Swelling of your face and throat  . A fast heartbeat  . A bad rash all over your body  . Dizziness and weakness    Immunizations Administered    Name Date Dose VIS Date Route   Pfizer COVID-19 Vaccine 10/23/2019  9:57 AM 0.3 mL 08/23/2019 Intramuscular   Manufacturer: [place]   Lot: [id number]   [place]: [id number]

## 2019-12-16 DIAGNOSIS — H401132 Primary open-angle glaucoma, bilateral, moderate stage: Secondary | ICD-10-CM | POA: Diagnosis not present

## 2019-12-16 DIAGNOSIS — H1789 Other corneal scars and opacities: Secondary | ICD-10-CM | POA: Diagnosis not present

## 2020-01-13 ENCOUNTER — Other Ambulatory Visit: Payer: Self-pay | Admitting: Internal Medicine

## 2020-01-13 MED FILL — HYDROCHLOROTHIAZIDE 25 MG T: 25 | 90 days supply | Qty: 90 | Fill #2

## 2020-01-13 MED FILL — BENAZEPRIL HCL 20 MG TABLET: 20 | 90 days supply | Qty: 90 | Fill #0

## 2020-03-09 ENCOUNTER — Telehealth: Payer: Self-pay | Admitting: Internal Medicine

## 2020-03-09 NOTE — Telephone Encounter (Signed)
Veronica Mcgrath [phone]  [place] called to say she would like to get a referral for hearing.

## 2020-03-09 NOTE — Telephone Encounter (Signed)
She needs to see which Audiologists are covered with her insurance plan. We can mail her a prescription for hearing evaluation and she can choose an audiologist.

## 2020-03-09 NOTE — Telephone Encounter (Signed)
Called and LVM that patient would need to check and see who insurance would cover and that we were mailing RX so she could go where ever insurance wants her to go.

## 2020-04-13 ENCOUNTER — Other Ambulatory Visit: Payer: Self-pay | Admitting: Internal Medicine

## 2020-04-13 MED FILL — HYDROCHLOROTHIAZIDE 25 MG T: 25 | 90 days supply | Qty: 90 | Fill #0

## 2020-04-13 MED FILL — BENAZEPRIL HCL 20 MG TABLET: 20 | 90 days supply | Qty: 90 | Fill #0

## 2020-04-24 ENCOUNTER — Other Ambulatory Visit: Payer: Medicare Other | Admitting: Internal Medicine

## 2020-04-24 ENCOUNTER — Other Ambulatory Visit: Payer: Self-pay

## 2020-04-24 DIAGNOSIS — E7800 Pure hypercholesterolemia, unspecified: Secondary | ICD-10-CM | POA: Diagnosis not present

## 2020-04-24 DIAGNOSIS — I10 Essential (primary) hypertension: Secondary | ICD-10-CM | POA: Diagnosis not present

## 2020-04-24 DIAGNOSIS — Z0000 Encounter for general adult medical examination without abnormal findings: Secondary | ICD-10-CM

## 2020-04-24 DIAGNOSIS — H3530 Unspecified macular degeneration: Secondary | ICD-10-CM

## 2020-04-24 LAB — CBC WITH DIFFERENTIAL/PLATELET
Absolute Monocytes: 650 cells/uL (ref 200–950)
Basophils Absolute: 62 cells/uL (ref 0–200)
Basophils Relative: 1.1 %
Eosinophils Absolute: 118 cells/uL (ref 15–500)
Eosinophils Relative: 2.1 %
HCT: 44.7 % (ref 35.0–45.0)
Hemoglobin: 14.9 g/dL (ref 11.7–15.5)
Lymphs Abs: 1473 cells/uL (ref 850–3900)
MCH: 30.5 pg (ref 27.0–33.0)
MCHC: 33.3 g/dL (ref 32.0–36.0)
MCV: 91.4 fL (ref 80.0–100.0)
MPV: 9.3 fL (ref 7.5–12.5)
Monocytes Relative: 11.6 %
Neutro Abs: 3298 cells/uL (ref 1500–7800)
Neutrophils Relative %: 58.9 %
Platelets: 288 10*3/uL (ref 140–400)
RBC: 4.89 10*6/uL (ref 3.80–5.10)
RDW: 12.4 % (ref 11.0–15.0)
Total Lymphocyte: 26.3 %
WBC: 5.6 10*3/uL (ref 3.8–10.8)

## 2020-04-24 LAB — COMPLETE METABOLIC PANEL WITH GFR
AG Ratio: 1.7 (calc) (ref 1.0–2.5)
ALT: 12 U/L (ref 6–29)
AST: 12 U/L (ref 10–35)
Albumin: 4.2 g/dL (ref 3.6–5.1)
Alkaline phosphatase (APISO): 51 U/L (ref 37–153)
BUN: 13 mg/dL (ref 7–25)
CO2: 34 mmol/L — ABNORMAL HIGH (ref 20–32)
Calcium: 9.8 mg/dL (ref 8.6–10.4)
Chloride: 97 mmol/L — ABNORMAL LOW (ref 98–110)
Creat: 0.85 mg/dL (ref 0.60–0.88)
GFR, Est African American: 73 mL/min/{1.73_m2} (ref >=60)
GFR, Est Non African American: 63 mL/min/{1.73_m2} (ref >=60)
Globulin: 2.5 g/dL (calc) (ref 1.9–3.7)
Glucose, Bld: 92 mg/dL (ref 65–99)
Potassium: 4.6 mmol/L (ref 3.5–5.3)
Sodium: 134 mmol/L — ABNORMAL LOW (ref 135–146)
Total Bilirubin: 0.6 mg/dL (ref 0.2–1.2)
Total Protein: 6.7 g/dL (ref 6.1–8.1)

## 2020-04-24 LAB — LIPID PANEL
Cholesterol: 248 mg/dL — ABNORMAL HIGH (ref <200)
HDL: 69 mg/dL (ref >=50)
LDL Cholesterol (Calc): 154 mg/dL (calc) — ABNORMAL HIGH
Non-HDL Cholesterol (Calc): 179 mg/dL (calc) — ABNORMAL HIGH (ref <130)
Total CHOL/HDL Ratio: 3.6 (calc) (ref <5.0)
Triglycerides: 126 mg/dL (ref <150)

## 2020-04-24 LAB — TSH: TSH: 2.72 mIU/L (ref 0.40–4.50)

## 2020-04-27 ENCOUNTER — Ambulatory Visit (INDEPENDENT_AMBULATORY_CARE_PROVIDER_SITE_OTHER): Payer: Medicare Other | Admitting: Internal Medicine

## 2020-04-27 ENCOUNTER — Other Ambulatory Visit: Payer: Self-pay

## 2020-04-27 ENCOUNTER — Encounter: Payer: Self-pay | Admitting: Internal Medicine

## 2020-04-27 ENCOUNTER — Other Ambulatory Visit: Payer: Self-pay | Admitting: Internal Medicine

## 2020-04-27 VITALS — BP 130/80 | HR 84 | Ht 68.0 in | Wt 155.0 lb

## 2020-04-27 DIAGNOSIS — Z800 Family history of malignant neoplasm of digestive organs: Secondary | ICD-10-CM

## 2020-04-27 DIAGNOSIS — H903 Sensorineural hearing loss, bilateral: Secondary | ICD-10-CM

## 2020-04-27 DIAGNOSIS — K644 Residual hemorrhoidal skin tags: Secondary | ICD-10-CM

## 2020-04-27 DIAGNOSIS — H3530 Unspecified macular degeneration: Secondary | ICD-10-CM

## 2020-04-27 DIAGNOSIS — Z0000 Encounter for general adult medical examination without abnormal findings: Secondary | ICD-10-CM

## 2020-04-27 DIAGNOSIS — K5900 Constipation, unspecified: Secondary | ICD-10-CM

## 2020-04-27 DIAGNOSIS — I10 Essential (primary) hypertension: Secondary | ICD-10-CM

## 2020-04-27 DIAGNOSIS — E7800 Pure hypercholesterolemia, unspecified: Secondary | ICD-10-CM

## 2020-04-27 LAB — POCT URINALYSIS DIPSTICK
Appearance: NEGATIVE
Bilirubin, UA: NEGATIVE
Blood, UA: NEGATIVE
Glucose, UA: NEGATIVE
Ketones, UA: NEGATIVE
Leukocytes, UA: NEGATIVE
Nitrite, UA: NEGATIVE
Odor: NEGATIVE
Protein, UA: NEGATIVE
Spec Grav, UA: 1.01 (ref 1.010–1.025)
Urobilinogen, UA: 0.2 E.U./dL
pH, UA: 6.5 (ref 5.0–8.0)

## 2020-04-27 MED ORDER — KETOCONAZOLE 2 % EX SHAM: 1.0000 "application " | MEDICATED_SHAMPOO | CUTANEOUS | 0 refills | Status: DC

## 2020-04-27 MED ORDER — HYDROCORTISONE (PERIANAL) 2.5 % EX CREA: 1.0000 "application " | TOPICAL_CREAM | Freq: Two times a day (BID) | CUTANEOUS | 99 refills | Status: DC

## 2020-04-27 MED ORDER — ROSUVASTATIN CALCIUM 5 MG PO TABS: 5.0000 mg | ORAL_TABLET | Freq: Every day | ORAL | 3 refills | Status: DC

## 2020-04-27 MED ORDER — HYDROCHLOROTHIAZIDE 25 MG PO TABS: 25.0000 mg | ORAL_TABLET | Freq: Every day | ORAL | 3 refills | Status: DC

## 2020-04-27 MED ORDER — BENAZEPRIL HCL 20 MG PO TABS: 20.0000 mg | ORAL_TABLET | Freq: Every day | ORAL | 3 refills | Status: DC

## 2020-04-27 MED FILL — ROSUVASTATIN CALCIUM 5 MG T: 5 | 90 days supply | Qty: 90 | Fill #0

## 2020-04-27 MED FILL — PROCTOZONE-HC 2.5 % CREA: 2.5 | 15 days supply | Qty: 30 | Fill #0

## 2020-04-27 MED FILL — KETOCONAZOLE 2% SHAMPOO: 2 | 30 days supply | Qty: 120 | Fill #0

## 2020-04-27 NOTE — Progress Notes (Signed)
Subjective:    Patient ID: Veronica Mcgrath, female    DOB: [birth year], 85 y.o.   MRN: [id number]  HPI 85 year old Female for health maintenance exam. She has a history of hyperlipidemia and essential hypertension.  History of glaucoma and allergic rhinitis.  Has tried statin medication but did not like how she felt on it and was concerned about side effects.  No known drug allergies.  Having some issues with perirectal irritation/external hemorrhoids.  Have prescribed Anusol cream to use twice daily as needed.  Because of Family history of colon cancer, and ask her to do Cologuard testing.  She is agreeable.  Has been diagnosed with macular degeneration of both eyes.  Having some issues with hearing loss and needs to see audiologist.  Colonoscopy done 2011 by Dr. Watt Climes showing hyperplastic polyp.  Issues with constipation discussed.  She may want to try MiraLAX.  Past medical history: Status post cataract extraction both eyes around 2008.  Lumbar spine surgery 2010.  Social history: She is retired.  She is a widow.  She has a Midwife.  Social alcohol consumption consisting of wine but does not consume it daily.  Family history: Father died of colon cancer at age 64.  Mother died of stroke with history of diabetes mellitus at age 88.  1 sister in good health.  3 adult sons.  History of glaucoma treated by Dr. Ellie Lunch.  Had laser treatment for glaucoma by Dr. Baird Cancer.  He thought she might have thyroid eye disease.  I referred her to Dr. Loanne Drilling but he did not think she had thyroid disease.  Review of Systems needs 3D mammogram. Thinks she has sleep apnea. Has dry mouth. Son complained of snoring. Refer for sleep test. Tired in mornings. Referring to Audiologist of choice for hearing issues. Pushing hard to get stool out. Sometimes has diarrhea.      Objective:   Physical Exam Blood pressure 130/80, pulse 84, pulse oximetry 97% weight 155 pounds height 5 feet 8  inches BMI 23.57  Skin warm and dry.  Nodes none.  Pharynx is clear.  TMs are clear.  She is hard of hearing.  Neck is supple without JVD thyromegaly or carotid bruits.  Chest clear to auscultation.  Abdomen is soft nondistended without hepatosplenomegaly masses or tenderness.  No bleeding in the perirectal area.  There are couple of external hemorrhoid tags.       Assessment & Plan:  External hemorrhoids-treat with Anusol as needed  Hearing loss-needs to see audiologist  Macular degeneration followed by ophthalmologist  History of glaucoma-status post laser treatment by Dr. Baird Cancer  Essential hypertension treated with benazepril HCTZ and stable  Hyperlipidemia-total cholesterol is 248 and LDL cholesterol was 154.  She she agrees to try Crestor 5 mg daily with follow-up in 3 months.  Family history of colon cancer.  Last colonoscopy in 2011.  Agrees to do Cologuard.  Plan: Follow-up in 3 months with regard to hyperlipidemia.  Subjective:   Patient presents for Medicare Annual/Subsequent preventive examination.  Review Past Medical/Family/Social: See above   Risk Factors  Current exercise habits: Physically active Dietary issues discussed: Low-fat low carbohydrate  Cardiac risk factors: Hyperlipidemia and family history Depression Screen  (Note: if answer to either of the following is "Yes", a more complete depression screening is indicated)   Over the past two weeks, have you felt down, depressed or hopeless? No  Over the past two weeks, have you felt little interest or pleasure  in doing things? No Have you lost interest or pleasure in daily life? No Do you often feel hopeless? No Do you cry easily over simple problems? No   Activities of Daily Living  In your present state of health, do you have any difficulty performing the following activities?:   Driving? No  Managing money? No  Feeding yourself? No  Getting from bed to chair? No  Climbing a flight of stairs? No   Preparing food and eating?: No  Bathing or showering? No  Getting dressed: No  Getting to the toilet? No  Using the toilet:No  Moving around from place to place: No  In the past year have you fallen or had a near fall?:No  Are you sexually active? No  Do you have more than one partner? No   Hearing Difficulties: Yes Do you often ask people to speak up or repeat themselves?  Yes Do you experience ringing or noises in your ears? No  Do you have difficulty understanding soft or whispered voices?  Yes Do you feel that you have a problem with memory? No Do you often misplace items? No    Home Safety:  Do you have a smoke alarm at your residence? Yes Do you have grab bars in the bathroom?  Yes Do you have throw rugs in your house?  No   Cognitive Testing  Alert? Yes Normal Appearance?Yes  Oriented to person? Yes Place? Yes  Time? Yes  Recall of three objects?  Not tested Can perform simple calculations? Yes  Displays appropriate judgment?Yes  Can read the correct time from a watch face?Yes   List the Names of Other Physician/Practitioners you currently use:  See referral list for the physicians patient is currently seeing.     Review of Systems: See above   Objective:     General appearance: Appears a bit younger than stated age Head: Normocephalic, without obvious abnormality, atraumatic  Eyes: conj clear, EOMi PEERLA  Ears: normal TM's and external ear canals both ears  Nose: Nares normal. Septum midline. Mucosa normal. No drainage or sinus tenderness.  Throat: lips, mucosa, and tongue normal; teeth and gums normal  Neck: no adenopathy, no carotid bruit, no JVD, supple, symmetrical, trachea midline and thyroid not enlarged, symmetric, no tenderness/mass/nodules  No CVA tenderness.  Lungs: clear to auscultation bilaterally  Breasts: normal appearance, no masses or tenderness Heart: regular rate and rhythm, S1, S2 normal, no murmur, click, rub or gallop  Abdomen:  soft, non-tender; bowel sounds normal; no masses, no organomegaly  Musculoskeletal: ROM normal in all joints, no crepitus, no deformity, Normal muscle strengthen. Back  is symmetric, no curvature. Skin: Skin color, texture, turgor normal. No rashes or lesions  Lymph nodes: Cervical, supraclavicular, and axillary nodes normal.  Neurologic: CN 2 -12 Normal, Normal symmetric reflexes. Normal coordination and gait  Psych: Alert & Oriented x 3, Mood appear stable.    Assessment:    Annual wellness medicare exam   Plan:    During the course of the visit the patient was educated and counseled about appropriate screening and preventive services including:   Annual flu vaccine  COVID-19 vaccines have been given x 2  Pneumococcal immunizations are up-to-date     Patient Instructions (the written plan) was given to the patient.  Medicare Attestation  I have personally reviewed:  The patient's medical and social history  Their use of alcohol, tobacco or illicit drugs  Their current medications and supplements  The patient's functional ability including  ADLs,fall risks, home safety risks, cognitive, and hearing and visual impairment  Diet and physical activities  Evidence for depression or mood disorders  The patient's weight, height, BMI, and visual acuity have been recorded in the chart. I have made referrals, counseling, and provided education to the patient based on review of the above and I have provided the patient with a written personalized care plan for preventive services.

## 2020-04-27 NOTE — Patient Instructions (Addendum)
Try Miralax for constipation. Cologard test ordered. Annusol cream if needed for external hemorrhoids.  See Audiologist.  Try Crestor 5 mg daily and follow-up in 3 months.

## 2020-04-28 ENCOUNTER — Other Ambulatory Visit: Payer: Self-pay | Admitting: Internal Medicine

## 2020-04-28 DIAGNOSIS — Z1231 Encounter for screening mammogram for malignant neoplasm of breast: Secondary | ICD-10-CM

## 2020-05-12 DIAGNOSIS — Z961 Presence of intraocular lens: Secondary | ICD-10-CM | POA: Diagnosis not present

## 2020-05-12 DIAGNOSIS — H401122 Primary open-angle glaucoma, left eye, moderate stage: Secondary | ICD-10-CM | POA: Diagnosis not present

## 2020-05-12 DIAGNOSIS — H1789 Other corneal scars and opacities: Secondary | ICD-10-CM | POA: Diagnosis not present

## 2020-05-12 DIAGNOSIS — H401111 Primary open-angle glaucoma, right eye, mild stage: Secondary | ICD-10-CM | POA: Diagnosis not present

## 2020-05-21 ENCOUNTER — Ambulatory Visit
Admission: RE | Admit: 2020-05-21 | Discharge: 2020-05-21 | Disposition: A | Discharge status: Home / Self Care | Payer: Medicare Other | Source: Ambulatory Visit | Attending: Internal Medicine | Admitting: Internal Medicine

## 2020-05-21 ENCOUNTER — Other Ambulatory Visit: Payer: Self-pay

## 2020-05-21 DIAGNOSIS — Z1231 Encounter for screening mammogram for malignant neoplasm of breast: Secondary | ICD-10-CM | POA: Diagnosis not present

## 2020-05-27 ENCOUNTER — Encounter: Payer: Self-pay | Admitting: Internal Medicine

## 2020-05-27 ENCOUNTER — Ambulatory Visit (INDEPENDENT_AMBULATORY_CARE_PROVIDER_SITE_OTHER): Payer: Medicare Other | Admitting: Internal Medicine

## 2020-05-27 ENCOUNTER — Other Ambulatory Visit: Payer: Self-pay

## 2020-05-27 VITALS — BP 108/70 | HR 68 | Temp 98.2°F | Ht 68.0 in | Wt 155.0 lb

## 2020-05-27 DIAGNOSIS — Z23 Encounter for immunization: Secondary | ICD-10-CM

## 2020-05-27 NOTE — Progress Notes (Signed)
Flu vaccine by CMA 

## 2020-05-27 NOTE — Patient Instructions (Signed)
Flu vaccine given.

## 2020-06-02 ENCOUNTER — Telehealth: Payer: Self-pay | Admitting: Internal Medicine

## 2020-06-02 NOTE — Telephone Encounter (Signed)
She needs to make her own appt. [facility] Dr Denna Haggard

## 2020-06-02 NOTE — Telephone Encounter (Signed)
Veronica Mcgrath [phone]  Veronica Mcgrath called to say the shampoo is not working on her itchy scalp so she needs a referral to dermatology.

## 2020-06-02 NOTE — Telephone Encounter (Signed)
Called patient back and gave her phone number and address [phone]

## 2020-06-22 ENCOUNTER — Other Ambulatory Visit: Payer: Self-pay

## 2020-06-22 ENCOUNTER — Ambulatory Visit
Admission: RE | Admit: 2020-06-22 | Discharge: 2020-06-22 | Disposition: A | Discharge status: Home / Self Care | Payer: Medicare Other | Source: Ambulatory Visit | Attending: Internal Medicine | Admitting: Internal Medicine

## 2020-06-22 ENCOUNTER — Ambulatory Visit (INDEPENDENT_AMBULATORY_CARE_PROVIDER_SITE_OTHER): Payer: Medicare Other | Admitting: Internal Medicine

## 2020-06-22 ENCOUNTER — Encounter: Payer: Self-pay | Admitting: Internal Medicine

## 2020-06-22 ENCOUNTER — Telehealth: Payer: Self-pay | Admitting: Internal Medicine

## 2020-06-22 VITALS — BP 120/86 | HR 62 | Temp 99.5°F | Ht 68.0 in | Wt 157.0 lb

## 2020-06-22 DIAGNOSIS — Z800 Family history of malignant neoplasm of digestive organs: Secondary | ICD-10-CM

## 2020-06-22 DIAGNOSIS — R1032 Left lower quadrant pain: Secondary | ICD-10-CM

## 2020-06-22 DIAGNOSIS — Z8719 Personal history of other diseases of the digestive system: Secondary | ICD-10-CM

## 2020-06-22 DIAGNOSIS — M47816 Spondylosis without myelopathy or radiculopathy, lumbar region: Secondary | ICD-10-CM | POA: Diagnosis not present

## 2020-06-22 LAB — CBC WITH DIFFERENTIAL/PLATELET
Absolute Monocytes: 896 cells/uL (ref 200–950)
Basophils Absolute: 44 cells/uL (ref 0–200)
Basophils Relative: 0.5 %
Eosinophils Absolute: 70 cells/uL (ref 15–500)
Eosinophils Relative: 0.8 %
HCT: 43.6 % (ref 35.0–45.0)
Hemoglobin: 14.4 g/dL (ref 11.7–15.5)
Lymphs Abs: 1566 cells/uL (ref 850–3900)
MCH: 30.2 pg (ref 27.0–33.0)
MCHC: 33 g/dL (ref 32.0–36.0)
MCV: 91.4 fL (ref 80.0–100.0)
MPV: 10.3 fL (ref 7.5–12.5)
Monocytes Relative: 10.3 %
Neutro Abs: 6125 cells/uL (ref 1500–7800)
Neutrophils Relative %: 70.4 %
Platelets: 261 10*3/uL (ref 140–400)
RBC: 4.77 10*6/uL (ref 3.80–5.10)
RDW: 11.9 % (ref 11.0–15.0)
Total Lymphocyte: 18 %
WBC: 8.7 10*3/uL (ref 3.8–10.8)

## 2020-06-22 LAB — POCT URINALYSIS DIPSTICK
Appearance: NEGATIVE
Bilirubin, UA: NEGATIVE
Blood, UA: NEGATIVE
Glucose, UA: NEGATIVE
Ketones, UA: NEGATIVE
Leukocytes, UA: NEGATIVE
Nitrite, UA: NEGATIVE
Odor: NEGATIVE
Protein, UA: NEGATIVE
Spec Grav, UA: 1.01 (ref 1.010–1.025)
Urobilinogen, UA: 0.2 E.U./dL
pH, UA: 6 (ref 5.0–8.0)

## 2020-06-22 LAB — BASIC METABOLIC PANEL
BUN: 9 mg/dL (ref 7–25)
CO2: 31 mmol/L (ref 20–32)
Calcium: 9.5 mg/dL (ref 8.6–10.4)
Chloride: 91 mmol/L — ABNORMAL LOW (ref 98–110)
Creat: 0.75 mg/dL (ref 0.60–0.88)
Glucose, Bld: 96 mg/dL (ref 65–99)
Potassium: 3.7 mmol/L (ref 3.5–5.3)
Sodium: 131 mmol/L — ABNORMAL LOW (ref 135–146)

## 2020-06-22 NOTE — Telephone Encounter (Signed)
Veronica Mcgrath [phone]  Veronica Mcgrath called to say that she has a constant pain that started yesterday, she was constipated when it started. Now she has the runs and is having chills. No fever that she knows of.

## 2020-06-22 NOTE — Telephone Encounter (Signed)
Advise taking temperature twice a day. Put self on clear liquids ginger ale, sprite, and maybe crackers for 24-48 hours.

## 2020-06-22 NOTE — Telephone Encounter (Signed)
Scheduled office visit

## 2020-06-22 NOTE — Progress Notes (Signed)
   Subjective:    Patient ID: Veronica Mcgrath, female    DOB: [birth year], 85 y.o.   MRN: [id number]  HPI 85 year old Female seen today for constant pain in left lower quadrant that started yesterday.  Patient complaining of constipation when the pain started.  When she called today she said she had some diarrhea and was having chills but no documented fever.  On arrival she is not febrile.  She is anxious.  Has not vomited.  Has had some issues in the past with perirectal irritation and external hemorrhoids.  Cologuard testing was recommended at time of physical in August.  She had a colonoscopy done in 2011 by Dr. Watt Climes showing hyperplastic polyp.  Issues then with constipation.  Have discussed MiraLAX as treatment for constipation.  Social history: She is retired.  She is a widow.  Social alcohol consumption consisting of wine but does not consume it daily.  Family history: Father died of colon cancer at age 6.  History of glaucoma treated by Dr. Ellie Lunch.  History of macular degeneration of both eyes.    Review of Systems see above     Objective:   Physical Exam Blood pressure 120/86 pulse 62 temperature 99.5 degrees pulse oximetry 97% BMI 23.87.  Skin warm and dry.  She is anxious.  Chest clear to auscultation.  Cardiac exam regular rate and rhythm.  Abdomen is soft.  Very mild tenderness in the left lower quadrant without significant rebound tenderness.  No hepatosplenomegaly appreciated.       Assessment & Plan:  ?  Constipation versus acute diverticulitis  Anxiety state  Family history of colon cancer  Plan: She will have CT of the abdomen and pelvis tomorrow as it is late in the day today and we cannot get it scheduled.  She will stay with clear liquids for now.  Addendum: June 23, 2020: CT of abdomen and pelvis shows no acute intra-abdominal or pelvic pathology.  No bowel obstruction.  Has bilateral ovarian cyst.  Indeterminate enhancing right liver lesion.  Sigmoid  diverticulosis noted.  We have asked her to return for follow-up October 18.

## 2020-06-23 ENCOUNTER — Ambulatory Visit
Admission: RE | Admit: 2020-06-23 | Discharge: 2020-06-23 | Disposition: A | Discharge status: Home / Self Care | Payer: Medicare Other | Source: Ambulatory Visit | Attending: Internal Medicine | Admitting: Internal Medicine

## 2020-06-23 DIAGNOSIS — I700 Atherosclerosis of aorta: Secondary | ICD-10-CM | POA: Diagnosis not present

## 2020-06-23 DIAGNOSIS — K5730 Diverticulosis of large intestine without perforation or abscess without bleeding: Secondary | ICD-10-CM | POA: Diagnosis not present

## 2020-06-23 DIAGNOSIS — N83292 Other ovarian cyst, left side: Secondary | ICD-10-CM | POA: Diagnosis not present

## 2020-06-23 DIAGNOSIS — R1032 Left lower quadrant pain: Secondary | ICD-10-CM

## 2020-06-23 DIAGNOSIS — K7689 Other specified diseases of liver: Secondary | ICD-10-CM | POA: Diagnosis not present

## 2020-06-23 MED ORDER — IOPAMIDOL (ISOVUE-300) INJECTION 61%
100.0000 mL | Freq: Once | INTRAVENOUS | Status: AC | PRN
  Administered 2020-06-23: 100 mL via INTRAVENOUS

## 2020-06-27 ENCOUNTER — Ambulatory Visit: Payer: Medicare Other

## 2020-06-29 ENCOUNTER — Other Ambulatory Visit: Payer: Self-pay | Admitting: Internal Medicine

## 2020-06-29 ENCOUNTER — Telehealth: Payer: Self-pay | Admitting: Internal Medicine

## 2020-06-29 ENCOUNTER — Encounter: Payer: Self-pay | Admitting: Internal Medicine

## 2020-06-29 ENCOUNTER — Ambulatory Visit (INDEPENDENT_AMBULATORY_CARE_PROVIDER_SITE_OTHER): Payer: Medicare Other | Admitting: Internal Medicine

## 2020-06-29 ENCOUNTER — Other Ambulatory Visit: Payer: Self-pay

## 2020-06-29 DIAGNOSIS — K769 Liver disease, unspecified: Secondary | ICD-10-CM | POA: Diagnosis not present

## 2020-06-29 MED ORDER — ALPRAZOLAM 0.25 MG PO TABS: 0.2500 mg | ORAL_TABLET | Freq: Two times a day (BID) | ORAL | 0 refills | Status: DC | PRN

## 2020-06-29 MED FILL — ALPRAZolam 0.25 MG TABS: 0.25 | 30 days supply | Qty: 60 | Fill #0

## 2020-06-29 NOTE — Telephone Encounter (Signed)
Change MRI to with/without contrast please

## 2020-06-29 NOTE — Addendum Note (Signed)
Addended by: Mady Haagensen on: 06/29/2020 11:40 AM   Modules accepted: Orders

## 2020-06-29 NOTE — Telephone Encounter (Signed)
Appointment Dr Altamese [facility] 07/29/2020 @10 :40  Faxed Demographics, office notes and CT abdomen

## 2020-06-29 NOTE — Telephone Encounter (Signed)
[facility]  Someone called from [facility] and ask that the order be changed from with contrast to With/without contrast

## 2020-06-29 NOTE — Progress Notes (Signed)
Subjective:    Patient ID: Veronica Mcgrath, female    DOB: 22-Jun-1935, 85 y.o.   MRN: [id number]  HPI Patient is 85 years old in good general health. Hx of HTN and hyperlipidemia. She is a widow. Son lives with her. Recently started statin medication in August.Total cholesterol was 248 and LDL was 154.Only taking Crestor 5 mg daily. Also on Lotensin and HCTZ. Hx of glaucoma. Was seen October 11 acutely for abdominal pain. Pain was in LLQ and diverticultis was suspected.CBC was normal. Sodium was low at 131 and chloride low at 91 thought to be due to decreased po intake .CT of abdomen raised possibility of 1.5 x 2.5 cm enhancing lesion in the right lobe of liver, possibly a hemangioma.  MRI was recommended.  This will be ordered.  Gallbladder was unremarkable.  No intrahepatic biliary ductal dilatation.  Pancreas appeared to be normal. Adrenals were normal.  No sign of diverticulitis but there was sigmoid diverticulosis without inflammation.  Appendix was not clearly visualized.  Bilateral ovarian cyst were noted measuring up to 2 cm on the left.   Review of Systems patient continues with some discomfort in left lower quadrant for the past several days despite being on soft foods.  No bowel movement in 5 days.  She is anxious.  Says she would like something for anxiety.  Went over CT of abdomen and pelvis with her once again in great detail.  Explained to her we would likely need to get an MRI of the liver.  It will be that this could be a hemangioma but need to rule out for another reason for this finding on CT in her liver.  This seemed to make her more anxious.  Went over in detail a soft diet that she could consume such as applesauce, pudding, English peas, mashed potatoes.  Recommended a Dulcolax suppository to see if pain would be relieved since no bowel movement in 5 days.  Have prescribed Xanax 0.25 mg up to twice daily for anxiety.  She is concerned about low blood pressure.  She can  hold off on benazepril for a couple of days.  Probably less activity over the past few days that she has not been feeling well. His blood pressure is over 259 systolic I am not too concerned but she can hold off on benazepril and to take HCTZ.  Moving her appointment to follow-up on hyperlipidemia to January.  I do not think Crestor is causing GI issues at this point.  But, it is the only thing new in her regimen.  Her last colonoscopy was done in 2011 by Dr. Watt Climes showing a hyperplastic polyp.  At her physical exam in August is having some issues with constipation and MiraLAX was recommended.  At that time ask her to do Cologuard testing.     Objective:   Physical Exam Blood pressure 102/60 weight is 154 pounds and stable from visit in August.  BMI 23.42.  Temperature 98.6 degrees pulse oximetry 95%.  Skin warm and dry.  Abdomen is soft nondistended without hepatosplenomegaly.  There is mild tenderness in the left lower quadrant without rebound tenderness.       Assessment & Plan:  Persistent left lower quadrant abdominal pain  Anxiety  Constipation  Abnormal finding on CT scan of liver-MRI ordered  Plan: CT did not show diverticulitis.  She currently is on soft diet.  She is anxious.  I prescribed Xanax 0.25 mg to take up to twice daily as  needed for anxiety.  May use Dulcolax suppository once to see if that will relieve constipation.  Continue with soft diet.  Is to proceed with MRI of liver to further delineate lesion noted on CT.  Explained to her this could be hemangioma or another entity.  Referral back to Dr. Watt Climes.  Time spent with patient this morning including reviewing labs, CT findings, examining patient, history taking, and medical decision making is 30 minutes.

## 2020-06-29 NOTE — Patient Instructions (Signed)
Referral to Dr. Watt Climes regarding left lower quadrant pain.  Is to have MRI of the liver to delineate abnormal finding on CT scan.

## 2020-06-29 NOTE — Telephone Encounter (Signed)
Done

## 2020-07-08 ENCOUNTER — Encounter: Payer: Self-pay | Admitting: Internal Medicine

## 2020-07-08 NOTE — Patient Instructions (Signed)
Stay on clear liquids.  CT of abdomen and pelvis ordered for tomorrow.  Call if symptoms worsen.

## 2020-07-11 ENCOUNTER — Ambulatory Visit: Payer: Medicare Other | Attending: Internal Medicine

## 2020-07-11 DIAGNOSIS — Z23 Encounter for immunization: Secondary | ICD-10-CM

## 2020-07-11 NOTE — Progress Notes (Signed)
   Covid-19 Vaccination Clinic  Name:  Veronica Mcgrath    MRN: [id number] DOB: 05/25/1935  07/11/2020  Veronica Mcgrath was observed post Covid-19 immunization for 15 minutes without incident. She was provided with Vaccine Information Sheet and instruction to access the V-Safe system.   Veronica Mcgrath was instructed to call 911 with any severe reactions post vaccine: Marland Kitchen Difficulty breathing  . Swelling of face and throat  . A fast heartbeat  . A bad rash all over body  . Dizziness and weakness

## 2020-07-15 ENCOUNTER — Other Ambulatory Visit: Payer: Self-pay

## 2020-07-15 MED ORDER — BENAZEPRIL HCL 20 MG PO TABS
20.0000 mg | ORAL_TABLET | Freq: Every day | ORAL | 3 refills | Status: DC
Start: 2020-07-15 — End: 2020-10-15

## 2020-07-15 MED FILL — BENAZEPRIL HCL 20 MG TABLET: 20 | 90 days supply | Qty: 90 | Fill #0

## 2020-07-15 MED FILL — ROSUVASTATIN CALCIUM 5 MG T: 5 | 90 days supply | Qty: 90 | Fill #1

## 2020-07-15 MED FILL — HYDROCHLOROTHIAZIDE 25 MG T: 25 | 90 days supply | Qty: 90 | Fill #1

## 2020-07-20 ENCOUNTER — Ambulatory Visit
Admission: RE | Admit: 2020-07-20 | Discharge: 2020-07-20 | Disposition: A | Discharge status: Home / Self Care | Payer: Medicare Other | Source: Ambulatory Visit | Attending: Internal Medicine | Admitting: Internal Medicine

## 2020-07-20 DIAGNOSIS — K769 Liver disease, unspecified: Secondary | ICD-10-CM

## 2020-07-20 DIAGNOSIS — D1809 Hemangioma of other sites: Secondary | ICD-10-CM | POA: Diagnosis not present

## 2020-07-20 MED ORDER — GADOBENATE DIMEGLUMINE 529 MG/ML IV SOLN
14.0000 mL | Freq: Once | INTRAVENOUS | Status: AC | PRN
  Administered 2020-07-20: 14 mL via INTRAVENOUS

## 2020-07-24 MED FILL — TIMOLOL MALEATE 0.5 % SOLN: 0.5 | 30 days supply | Qty: 5 | Fill #3

## 2020-07-27 ENCOUNTER — Other Ambulatory Visit: Payer: Medicare Other | Admitting: Internal Medicine

## 2020-07-28 ENCOUNTER — Ambulatory Visit: Payer: Medicare Other | Admitting: Internal Medicine

## 2020-07-29 DIAGNOSIS — K5901 Slow transit constipation: Secondary | ICD-10-CM | POA: Diagnosis not present

## 2020-07-29 DIAGNOSIS — K922 Gastrointestinal hemorrhage, unspecified: Secondary | ICD-10-CM | POA: Diagnosis not present

## 2020-07-29 DIAGNOSIS — K644 Residual hemorrhoidal skin tags: Secondary | ICD-10-CM | POA: Diagnosis not present

## 2020-07-29 DIAGNOSIS — R1032 Left lower quadrant pain: Secondary | ICD-10-CM | POA: Diagnosis not present

## 2020-07-29 DIAGNOSIS — Z86010 Personal history of colonic polyps: Secondary | ICD-10-CM | POA: Diagnosis not present

## 2020-09-16 DIAGNOSIS — D3131 Benign neoplasm of right choroid: Secondary | ICD-10-CM | POA: Diagnosis not present

## 2020-09-16 DIAGNOSIS — H43393 Other vitreous opacities, bilateral: Secondary | ICD-10-CM | POA: Diagnosis not present

## 2020-09-16 DIAGNOSIS — H35373 Puckering of macula, bilateral: Secondary | ICD-10-CM | POA: Diagnosis not present

## 2020-09-16 DIAGNOSIS — H353132 Nonexudative age-related macular degeneration, bilateral, intermediate dry stage: Secondary | ICD-10-CM | POA: Diagnosis not present

## 2020-10-01 ENCOUNTER — Other Ambulatory Visit: Payer: Medicare Other | Admitting: Internal Medicine

## 2020-10-01 DIAGNOSIS — E7800 Pure hypercholesterolemia, unspecified: Secondary | ICD-10-CM

## 2020-10-02 ENCOUNTER — Ambulatory Visit: Payer: Medicare Other | Admitting: Internal Medicine

## 2020-10-13 ENCOUNTER — Other Ambulatory Visit: Payer: Medicare Other | Admitting: Internal Medicine

## 2020-10-13 ENCOUNTER — Other Ambulatory Visit: Payer: Self-pay

## 2020-10-13 DIAGNOSIS — E7800 Pure hypercholesterolemia, unspecified: Secondary | ICD-10-CM | POA: Diagnosis not present

## 2020-10-13 LAB — HEPATIC FUNCTION PANEL
AG Ratio: 1.5 (calc) (ref 1.0–2.5)
ALT: 16 U/L (ref 6–29)
AST: 17 U/L (ref 10–35)
Albumin: 4.2 g/dL (ref 3.6–5.1)
Alkaline phosphatase (APISO): 54 U/L (ref 37–153)
Bilirubin, Direct: 0.1 mg/dL (ref 0.0–0.2)
Globulin: 2.8 g/dL (calc) (ref 1.9–3.7)
Indirect Bilirubin: 0.5 mg/dL (calc) (ref 0.2–1.2)
Total Bilirubin: 0.6 mg/dL (ref 0.2–1.2)
Total Protein: 7 g/dL (ref 6.1–8.1)

## 2020-10-13 LAB — LIPID PANEL
Cholesterol: 183 mg/dL (ref <200)
HDL: 75 mg/dL (ref >=50)
LDL Cholesterol (Calc): 87 mg/dL (calc)
Non-HDL Cholesterol (Calc): 108 mg/dL (calc) (ref <130)
Total CHOL/HDL Ratio: 2.4 (calc) (ref <5.0)
Triglycerides: 111 mg/dL (ref <150)

## 2020-10-15 ENCOUNTER — Encounter: Payer: Self-pay | Admitting: Internal Medicine

## 2020-10-15 ENCOUNTER — Other Ambulatory Visit: Payer: Self-pay

## 2020-10-15 ENCOUNTER — Ambulatory Visit (INDEPENDENT_AMBULATORY_CARE_PROVIDER_SITE_OTHER): Payer: Medicare Other | Admitting: Internal Medicine

## 2020-10-15 VITALS — BP 120/80 | HR 88 | Ht 68.0 in | Wt 156.0 lb

## 2020-10-15 DIAGNOSIS — E7800 Pure hypercholesterolemia, unspecified: Secondary | ICD-10-CM | POA: Diagnosis not present

## 2020-10-15 DIAGNOSIS — K644 Residual hemorrhoidal skin tags: Secondary | ICD-10-CM

## 2020-10-15 DIAGNOSIS — Z8719 Personal history of other diseases of the digestive system: Secondary | ICD-10-CM | POA: Diagnosis not present

## 2020-10-15 DIAGNOSIS — I10 Essential (primary) hypertension: Secondary | ICD-10-CM | POA: Diagnosis not present

## 2020-10-15 MED ORDER — BENAZEPRIL HCL 20 MG PO TABS
20.0000 mg | ORAL_TABLET | Freq: Every day | ORAL | 1 refills | Status: DC
Start: 2020-10-15 — End: 2021-04-19

## 2020-10-15 MED ORDER — ROSUVASTATIN CALCIUM 5 MG PO TABS
5.0000 mg | ORAL_TABLET | Freq: Every day | ORAL | 1 refills | Status: DC
Start: 2020-10-15 — End: 2021-04-19

## 2020-10-15 NOTE — Progress Notes (Signed)
   Subjective:    Patient ID: SANJANA FOLZ, female    DOB: December 24, 1934, 86 y.o.   MRN: [id number]  HPI 86 year old Female seen for 6 month follow up. Currently feels well.  History of hypertension treated with Lotensin and HCTZ.  Was seen in October for left lower quadrant abdominal pain that had onset the day before visit here.  She was complaining of constipation and subsequently had some diarrhea and chills but no documented fever.  Colonoscopy had been done by Dr. Watt Climes in 2011 showing a hyperplastic polyp.  CT of the abdomen in October indicated there was a 1.5 x 2.5 enhancing lesion in the right lobe of the liver possibly a hemangioma.  MRI was recommended.  MRI showed 3 small liver lesions which were 3 small cavernous hemangiomas in the liver.  Sigmoid diverticulosis noted.  No bowel obstruction.  No acute diverticulitis.  Patient had bilateral ovarian cysts.  Saw Dr. Watt Climes in November 2021 after a bout of abdominal pain.  Patient was advised to continue with MiraLAX and advised to adjust her dosages depending on her symptoms.  Was not thought to need hemorrhoid surgery.  Was felt that this could be treated topically if necessary.  History of external hemorrhoids and slow transit constipation.  With regard to hyperlipidemia, 7 months ago total cholesterol was 247 with an LDL cholesterol of 154.  Now lipid panel is entirely within normal limits.  She is taking Crestor 5 mg daily.    Review of Systems no new complaints     Objective:   Physical Exam  Blood pressure 120/80, pulse 88 regular pulse oximetry 97% weight 156 pounds BMI 23.72 height 5 feet 8 inches  Skin is warm and dry.  Nodes none.  Neck is supple without JVD thyromegaly or carotid bruits.  Chest is clear to auscultation without rales or wheezing.  Cardiac exam: Regular rate and rhythm normal S1 and S2 without murmurs or gallops.  No lower extremity edema.        Assessment & Plan:  Benign liver hemangioma x 3   based on MRI study  Essential hypertension stable on current regimen.  No change in medications  Hyperlipidemia-excellent results with Crestor 5 mg daily.  Market improvement from 7 months ago.  Slow transit constipation-treated with MiraLAX  External hemorrhoids-surgery not necessary  Plan: She looks well and feels well.  Continue current medications and follow-up with Medicare annual visit and health maintenance exam in August 2022.

## 2020-10-20 IMAGING — CT CT ABD-PELV W/ CM
2 of 5 series · 16 of 46 positions shown, 18 images · IV contrast (iopamidol)
Comparison: Abdominal radiograph dated 06/22/2020.

CLINICAL DATA: 85-year-old female with left lower quadrant
abdominal pain. Concern for acute diverticulitis.

EXAM:
CT ABDOMEN AND PELVIS WITH CONTRAST
TECHNIQUE: Multidetector CT imaging of the abdomen and pelvis was performed
using the standard protocol following bolus administration of
intravenous contrast.
CONTRAST:  100mL [id number] IOPAMIDOL ([id number]) INJECTION 61%

[Series 2: abd pelvis 5.00 br40 s3 axial · axial · 0.75mm/px · z∈[+1256,+1611]mm · 13 of 81 slices shown, 15 images]
[im 5/81  soft-tissue]
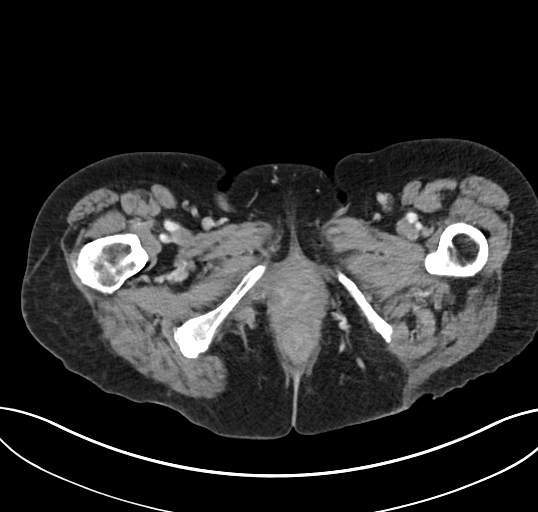
[im 5/81  bone]
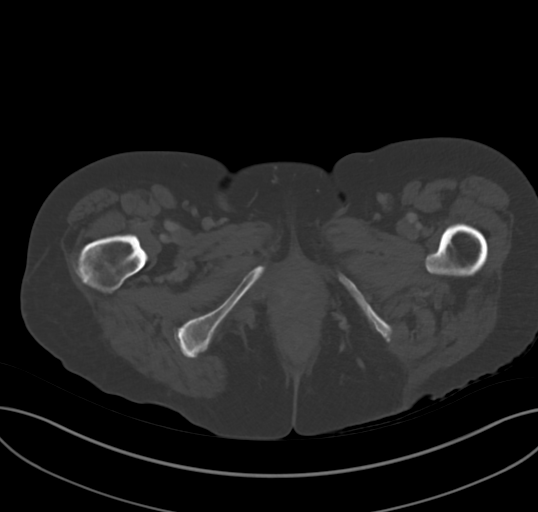
[im 10/81  soft-tissue]
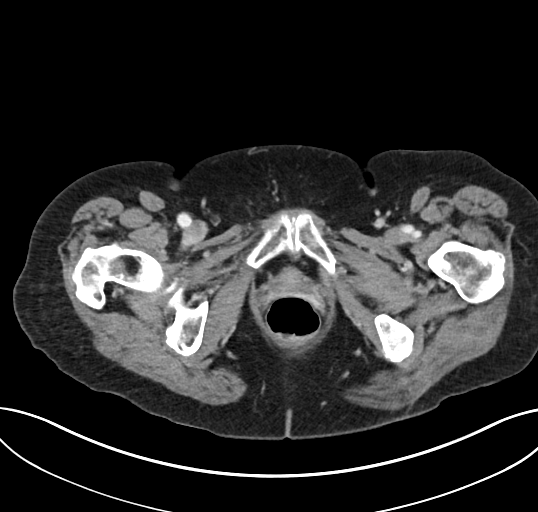
[im 19/81  soft-tissue]
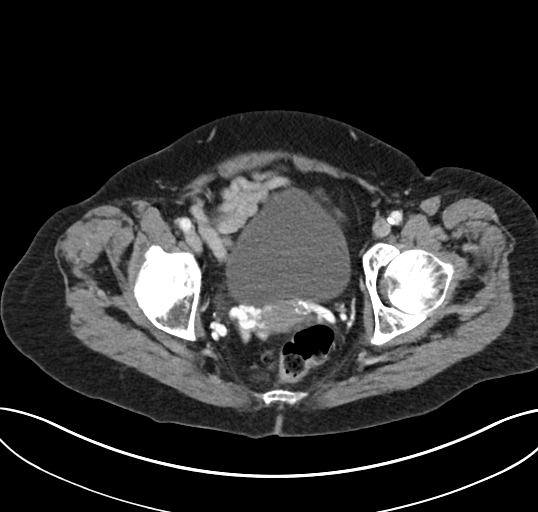
[im 24/81  soft-tissue]
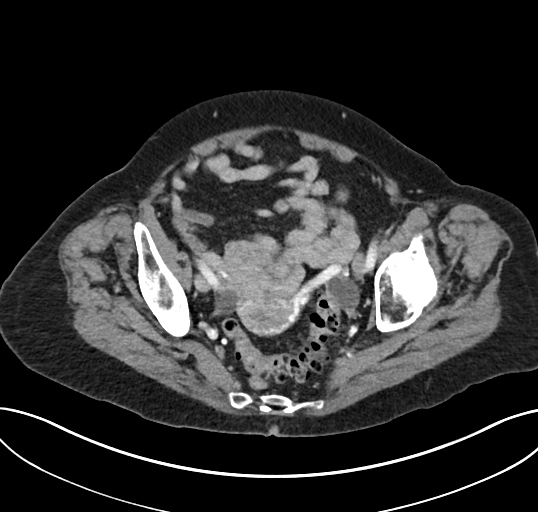
[im 29/81  soft-tissue]
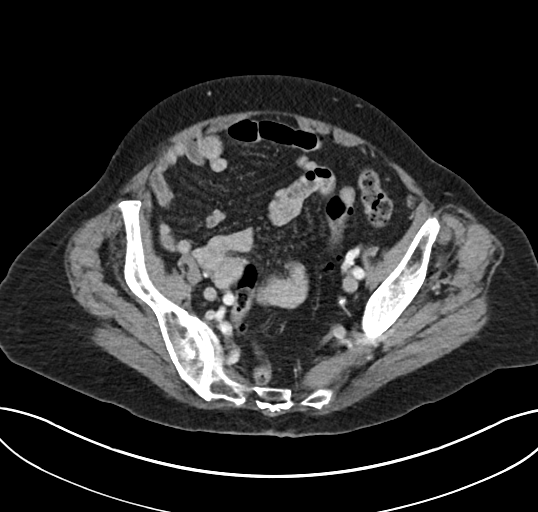
[im 33/81  soft-tissue]
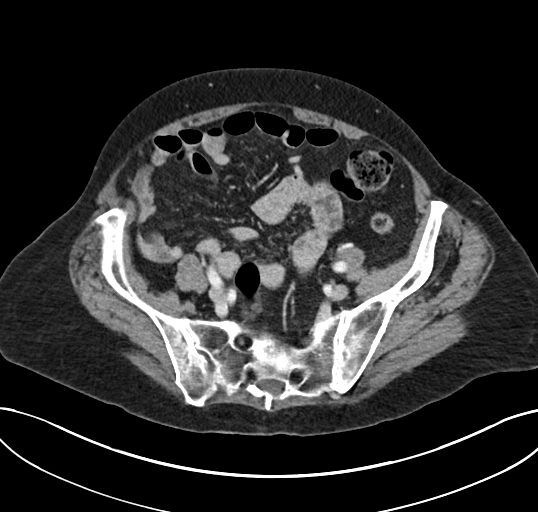
[im 43/81  soft-tissue]
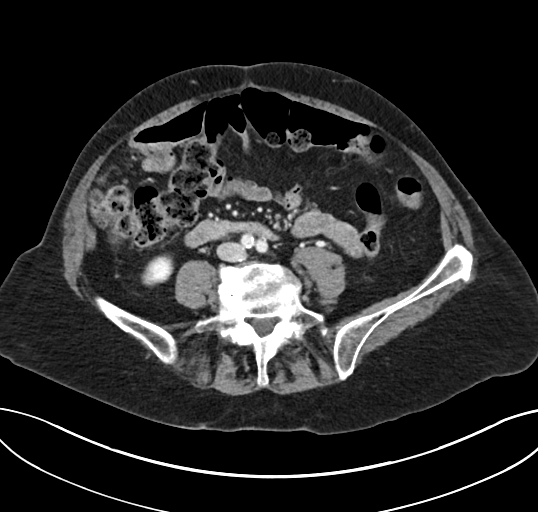
[im 48/81  soft-tissue]
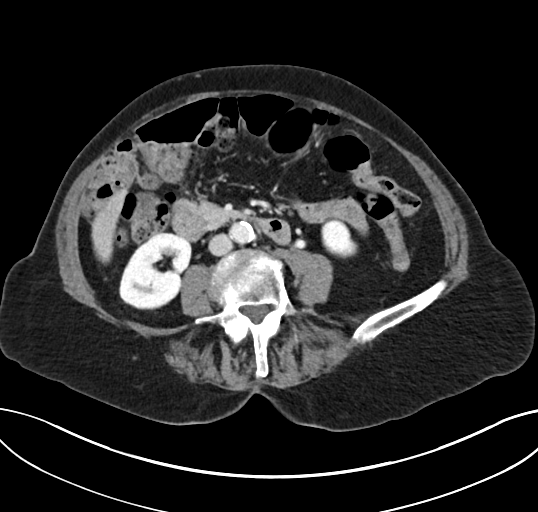
[im 52/81  soft-tissue]
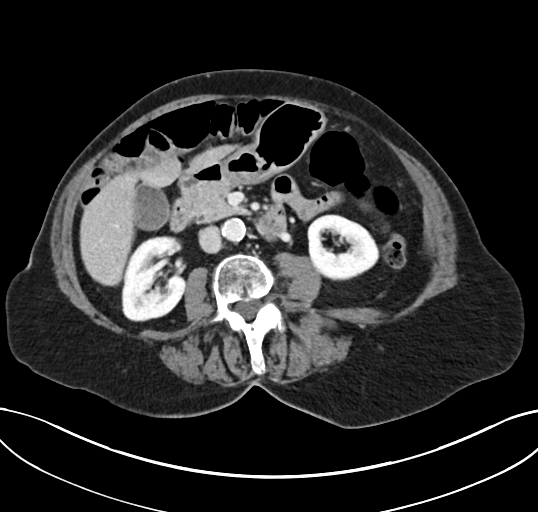
[im 52/81  bone]
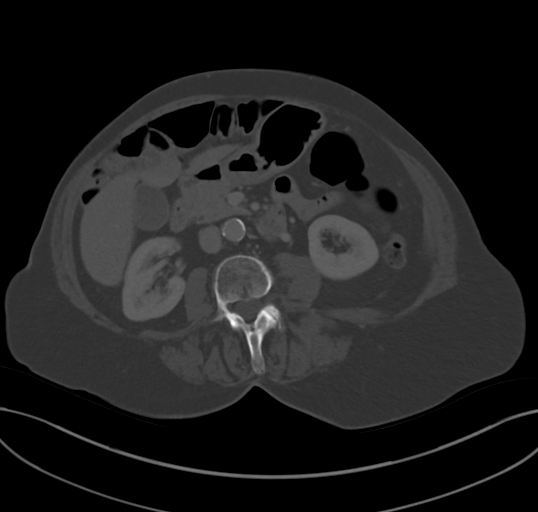
[im 57/81  soft-tissue]
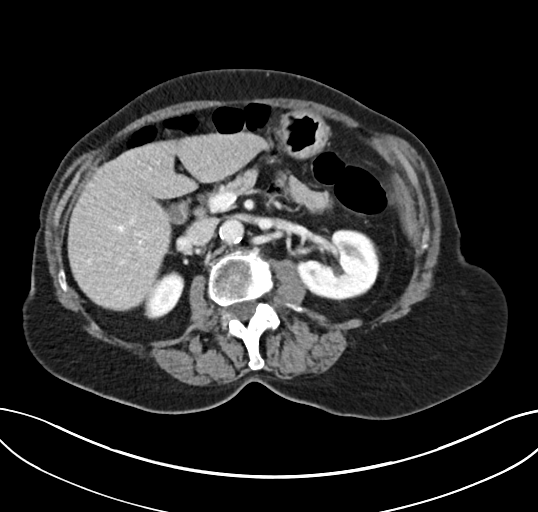
[im 62/81  soft-tissue]
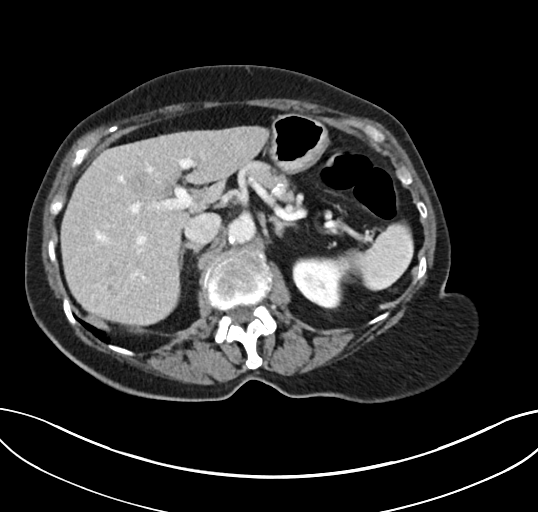
[im 71/81  soft-tissue]
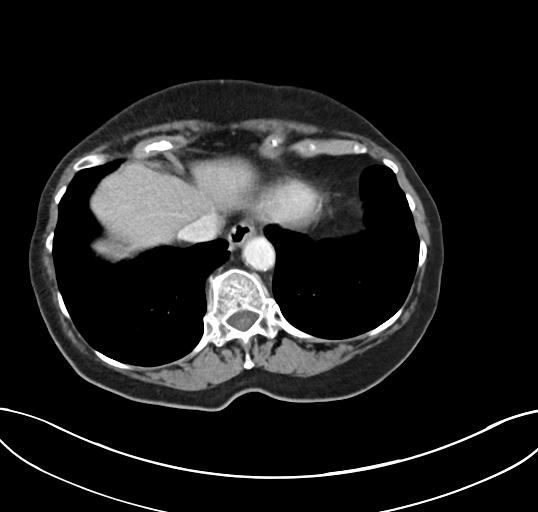
[im 76/81  soft-tissue]
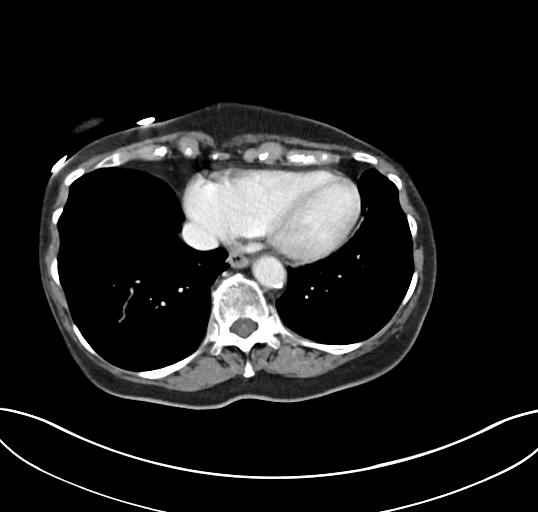

[Series 6: abd pelvis 2.00 br40 s3 cor · coronal · 0.79mm/px · 3 of 159 slices shown]
[im 53/159  soft-tissue]
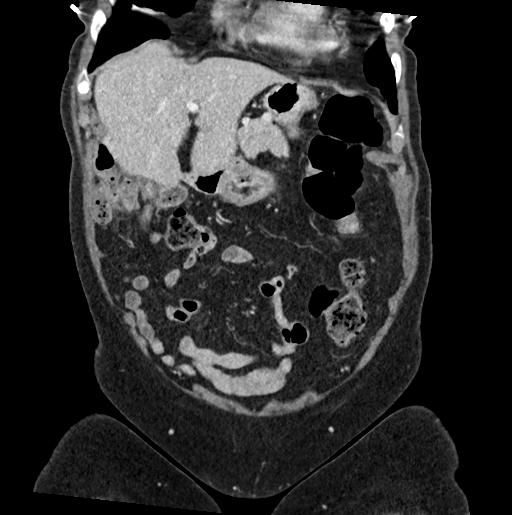
[im 71/159  soft-tissue]
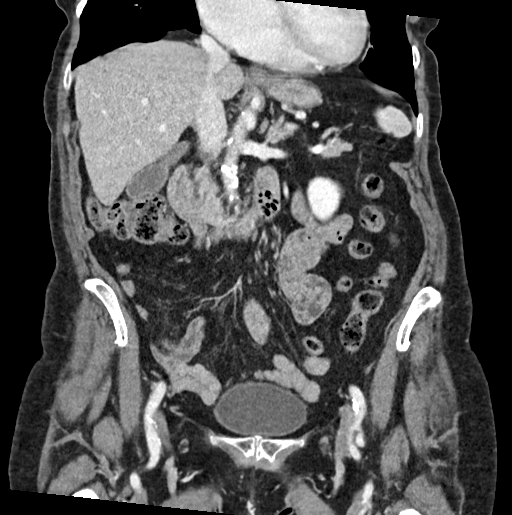
[im 88/159  soft-tissue]
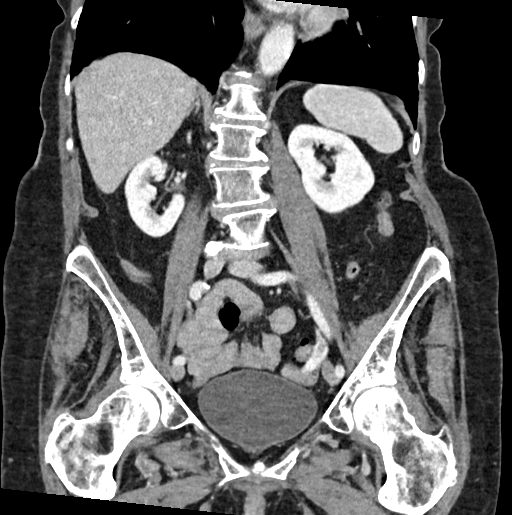

[16 of 46 positions shown; findings below may reference images not displayed]

FINDINGS: Lower chest: The visualized lung bases are clear.

No intra-abdominal free air or free fluid.

Hepatobiliary: Indeterminate 1.5 x 2.5 cm enhancing lesion in the
right lobe of the liver, possibly a hemangioma. Further
characterization with MRI without and with contrast recommended. The
liver is otherwise unremarkable. No intrahepatic biliary ductal
dilatation. The gallbladder is unremarkable.

Pancreas: Unremarkable. No pancreatic ductal dilatation or
surrounding inflammatory changes.

Spleen: Normal in size without focal abnormality.

Adrenals/Urinary Tract: The adrenal glands are unremarkable. The
kidneys, visualized ureters, and urinary bladder appear
unremarkable.

Stomach/Bowel: There is sigmoid diverticulosis without active
inflammatory changes. There is no bowel obstruction or active
inflammation. The appendix is not visualized with certainty. No
inflammatory changes identified in the right lower quadrant.

Vascular/Lymphatic: Moderate aortoiliac atherosclerotic disease. The
IVC is unremarkable. No portal venous gas. There is no adenopathy.

Reproductive: The uterus is grossly unremarkable. Bilateral ovarian
cysts measure up to 2 cm on the left. Pelvic ultrasound is
recommended for better evaluation. Mildly dilated pelvic
vasculatures.

Other: None

Musculoskeletal: Osteopenia with degenerative changes of the spine.
No acute osseous pathology.
IMPRESSION: 1. No acute intra-abdominal or pelvic pathology.
2. Sigmoid diverticulosis. No bowel obstruction.
3. Bilateral ovarian cysts. Pelvic ultrasound is recommended for
better evaluation.
4. Indeterminate enhancing right liver lesion. Further
characterization with MRI without and with contrast recommended.
5. Aortic Atherosclerosis (66PC0-L37.7).

## 2020-11-03 ENCOUNTER — Ambulatory Visit (INDEPENDENT_AMBULATORY_CARE_PROVIDER_SITE_OTHER): Payer: Medicare Other | Admitting: Dermatology

## 2020-11-03 ENCOUNTER — Other Ambulatory Visit: Payer: Self-pay

## 2020-11-03 ENCOUNTER — Encounter: Payer: Self-pay | Admitting: Dermatology

## 2020-11-03 DIAGNOSIS — R21 Rash and other nonspecific skin eruption: Secondary | ICD-10-CM | POA: Diagnosis not present

## 2020-11-03 DIAGNOSIS — R208 Other disturbances of skin sensation: Secondary | ICD-10-CM | POA: Diagnosis not present

## 2020-11-03 MED ORDER — CLOBETASOL PROPIONATE 0.05 % EX FOAM: Freq: Two times a day (BID) | CUTANEOUS | 0 refills | Status: DC

## 2020-11-03 NOTE — Progress Notes (Signed)
otc = Scalpicin Max Strength Scalp Itch Treatment

## 2020-11-03 NOTE — Patient Instructions (Addendum)
Step 1 Over the counter = Scalpicin Max Strength Scalp Itch Treatment Step 2 = Ask cost of the clobetasol foam before picking it up  cutaneous dysesthesia

## 2020-11-13 ENCOUNTER — Encounter: Payer: Self-pay | Admitting: Dermatology

## 2020-11-13 NOTE — Progress Notes (Signed)
° °  New Patient   Subjective  KELSEE PRESLAR is a 86 y.o. female who presents for the following: New Patient (Initial Visit) (Patient here for itchy scalp x several yrs-keeps her up at night, tried otc shampoo and pcp gave patient ketocanzole shapoo did not help.).  Itching Location: Scalp Duration: Years Quality:  Associated Signs/Symptoms: Severe enough to prevent sleep Modifying Factors: Over-the-counter plus prescription ketoconazole shampoo without benefit Severity:  Timing: Context:    The following portions of the chart were reviewed this encounter and updated as appropriate:  Tobacco   Allergies   Meds   Problems   Med Hx   Surg Hx   Fam Hx       Objective  Well appearing patient in no apparent distress; mood and affect are within normal limits. 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  I detailed what is known about this frustrating disorder along with the absence of an FDA approved therapy.  We will in the future we may have available nonopiate CNS itch receptor inhibitors, they are not yet a reality.  The systemic options include gabapentin, Lyrica, and Klonopin and I will certainly welcome Dr. Verlene Mayer opinion on whether Joretta is a candidate for any of these agents.    A focused examination was performed including Scalp, face, ears, neck, hands, fingernails.. Relevant physical exam findings are noted in the Assessment and Plan.   Assessment & Plan  Rash and other nonspecific skin eruption Mid Frontal Scalp  Clobetasol foam daily for 1 month discontinue if there is no improvement   clobetasol (OLUX) 0.05 % topical foam - Mid Frontal Scalp  Dysesthesia Mid Frontal Scalp  I have asked the family to contact my office in 7month.   If there has been no improvement, I will await Dr. Verlene Mayer input before deciding on a systemic neuroactive agent.

## 2020-11-22 NOTE — Patient Instructions (Signed)
Continue current medications.  It was a pleasure to see you today.  We are glad you are feeling better.  Return in August for Medicare wellness visit and health maintenance exam.

## 2021-01-13 DIAGNOSIS — H401111 Primary open-angle glaucoma, right eye, mild stage: Secondary | ICD-10-CM | POA: Diagnosis not present

## 2021-01-13 DIAGNOSIS — H401122 Primary open-angle glaucoma, left eye, moderate stage: Secondary | ICD-10-CM | POA: Diagnosis not present

## 2021-02-03 ENCOUNTER — Other Ambulatory Visit: Payer: Self-pay

## 2021-02-03 ENCOUNTER — Ambulatory Visit (INDEPENDENT_AMBULATORY_CARE_PROVIDER_SITE_OTHER): Payer: Medicare Other | Admitting: Dermatology

## 2021-02-03 ENCOUNTER — Encounter: Payer: Self-pay | Admitting: Dermatology

## 2021-02-03 DIAGNOSIS — R21 Rash and other nonspecific skin eruption: Secondary | ICD-10-CM

## 2021-02-03 MED ORDER — GABAPENTIN 100 MG PO CAPS: 100.0000 mg | ORAL_CAPSULE | Freq: Three times a day (TID) | ORAL | 0 refills | Status: DC

## 2021-02-16 ENCOUNTER — Encounter: Payer: Self-pay | Admitting: Dermatology

## 2021-02-16 NOTE — Progress Notes (Signed)
   Follow-Up Visit   Subjective  Veronica Mcgrath is a 86 y.o. female who presents for the following: Skin Problem (Scalp still itching- no better tx- clobetasol foam bid).  Persistent scalp itching despite regular use of topical clobetasol Location:  Duration:  Quality:  Associated Signs/Symptoms: Modifying Factors:  Severity:  Timing: Context:   Objective  Well appearing patient in no apparent distress; mood and affect are within normal limits. Objective  Mid Parietal Scalp: The lack of response to a class I potency topical cortisone along with the discordant amount of itching with minimal visible dermatitis suggest that the proper diagnosis is localized cutaneous dysesthesia.  I explained that this is a diagnosis of exclusion and there is no actual FDA approved therapy for this condition.  Some people do respond to the same types of medications as she used to treat peripheral neuropathy, but there is real potential for drowsiness or disorientation so I only use this type medication at night, I begin with pediatric dosage and increase incrementally slowly.  This will have to be okay with her primary care physician, Dr. Tommie Ard Baxley.    A focused examination was performed including Head and neck. Relevant physical exam findings are noted in the Assessment and Plan.   Assessment & Plan    Rash and other nonspecific skin eruption Mid Parietal Scalp  Gabapentin, 100mg  before sleep, if tolerated may increase by 100 mg every 10 to 14 days until she reaches 300 mg presleep and then call the office with a status report.  Ordered Medications: gabapentin (NEURONTIN) 100 MG capsule  Other Related Medications clobetasol (OLUX) 0.05 % topical foam      I, Lavonna Monarch, MD, have reviewed all documentation for this visit.  The documentation on 02/16/21 for the exam, diagnosis, procedures, and orders are all accurate and complete.

## 2021-03-18 ENCOUNTER — Other Ambulatory Visit: Payer: Self-pay | Admitting: Internal Medicine

## 2021-03-18 MED ORDER — ALPRAZOLAM 0.25 MG PO TABS: 0.2500 mg | ORAL_TABLET | Freq: Two times a day (BID) | ORAL | 0 refills | Status: DC | PRN

## 2021-03-18 NOTE — Telephone Encounter (Signed)
Veronica Mcgrath [phone]  [name] called to say she needs refill of Aprozalam .25mg  because she is using new pharmacy.  [facility] [id number] - [address], [place] Phone:  [phone]  Fax:  [phone]

## 2021-03-18 NOTE — Addendum Note (Signed)
Addended by: Mady Haagensen on: 03/18/2021 10:41 AM   Modules accepted: Orders

## 2021-04-13 ENCOUNTER — Other Ambulatory Visit: Payer: Self-pay | Admitting: Internal Medicine

## 2021-04-19 ENCOUNTER — Other Ambulatory Visit: Payer: Self-pay | Admitting: Internal Medicine

## 2021-04-26 ENCOUNTER — Other Ambulatory Visit: Payer: Self-pay

## 2021-04-26 ENCOUNTER — Other Ambulatory Visit: Payer: Medicare Other | Admitting: Internal Medicine

## 2021-04-26 DIAGNOSIS — E0590 Thyrotoxicosis, unspecified without thyrotoxic crisis or storm: Secondary | ICD-10-CM | POA: Diagnosis not present

## 2021-04-26 DIAGNOSIS — Z0000 Encounter for general adult medical examination without abnormal findings: Secondary | ICD-10-CM | POA: Diagnosis not present

## 2021-04-26 DIAGNOSIS — I10 Essential (primary) hypertension: Secondary | ICD-10-CM | POA: Diagnosis not present

## 2021-04-26 DIAGNOSIS — H903 Sensorineural hearing loss, bilateral: Secondary | ICD-10-CM

## 2021-04-26 DIAGNOSIS — E7800 Pure hypercholesterolemia, unspecified: Secondary | ICD-10-CM | POA: Diagnosis not present

## 2021-04-27 LAB — COMPLETE METABOLIC PANEL WITH GFR
AG Ratio: 1.8 (calc) (ref 1.0–2.5)
ALT: 15 U/L (ref 6–29)
AST: 15 U/L (ref 10–35)
Albumin: 4.4 g/dL (ref 3.6–5.1)
Alkaline phosphatase (APISO): 49 U/L (ref 37–153)
BUN: 12 mg/dL (ref 7–25)
CO2: 30 mmol/L (ref 20–32)
Calcium: 9.4 mg/dL (ref 8.6–10.4)
Chloride: 98 mmol/L (ref 98–110)
Creat: 0.72 mg/dL (ref 0.60–0.95)
Globulin: 2.5 g/dL (calc) (ref 1.9–3.7)
Glucose, Bld: 87 mg/dL (ref 65–99)
Potassium: 4 mmol/L (ref 3.5–5.3)
Sodium: 137 mmol/L (ref 135–146)
Total Bilirubin: 0.7 mg/dL (ref 0.2–1.2)
Total Protein: 6.9 g/dL (ref 6.1–8.1)
eGFR: 82 mL/min/{1.73_m2} (ref >=60)

## 2021-04-27 LAB — CBC WITH DIFFERENTIAL/PLATELET
Absolute Monocytes: 551 cells/uL (ref 200–950)
Basophils Absolute: 51 cells/uL (ref 0–200)
Basophils Relative: 1 %
Eosinophils Absolute: 158 cells/uL (ref 15–500)
Eosinophils Relative: 3.1 %
HCT: 46 % — ABNORMAL HIGH (ref 35.0–45.0)
Hemoglobin: 15.2 g/dL (ref 11.7–15.5)
Lymphs Abs: 1367 cells/uL (ref 850–3900)
MCH: 30.5 pg (ref 27.0–33.0)
MCHC: 33 g/dL (ref 32.0–36.0)
MCV: 92.4 fL (ref 80.0–100.0)
MPV: 10.2 fL (ref 7.5–12.5)
Monocytes Relative: 10.8 %
Neutro Abs: 2973 cells/uL (ref 1500–7800)
Neutrophils Relative %: 58.3 %
Platelets: 264 10*3/uL (ref 140–400)
RBC: 4.98 10*6/uL (ref 3.80–5.10)
RDW: 12.3 % (ref 11.0–15.0)
Total Lymphocyte: 26.8 %
WBC: 5.1 10*3/uL (ref 3.8–10.8)

## 2021-04-27 LAB — TSH: TSH: 3.49 mIU/L (ref 0.40–4.50)

## 2021-04-27 LAB — LIPID PANEL
Cholesterol: 169 mg/dL (ref <200)
HDL: 79 mg/dL (ref >=50)
LDL Cholesterol (Calc): 73 mg/dL (calc)
Non-HDL Cholesterol (Calc): 90 mg/dL (calc) (ref <130)
Total CHOL/HDL Ratio: 2.1 (calc) (ref <5.0)
Triglycerides: 88 mg/dL (ref <150)

## 2021-04-29 ENCOUNTER — Encounter: Payer: Self-pay | Admitting: Internal Medicine

## 2021-04-29 ENCOUNTER — Other Ambulatory Visit: Payer: Self-pay

## 2021-04-29 ENCOUNTER — Ambulatory Visit (INDEPENDENT_AMBULATORY_CARE_PROVIDER_SITE_OTHER): Payer: Medicare Other | Admitting: Internal Medicine

## 2021-04-29 VITALS — BP 98/60 | HR 82 | Ht 68.0 in | Wt 151.0 lb

## 2021-04-29 DIAGNOSIS — Z1211 Encounter for screening for malignant neoplasm of colon: Secondary | ICD-10-CM

## 2021-04-29 DIAGNOSIS — Z800 Family history of malignant neoplasm of digestive organs: Secondary | ICD-10-CM

## 2021-04-29 DIAGNOSIS — E7800 Pure hypercholesterolemia, unspecified: Secondary | ICD-10-CM

## 2021-04-29 DIAGNOSIS — H3530 Unspecified macular degeneration: Secondary | ICD-10-CM | POA: Diagnosis not present

## 2021-04-29 DIAGNOSIS — K5900 Constipation, unspecified: Secondary | ICD-10-CM | POA: Diagnosis not present

## 2021-04-29 DIAGNOSIS — I10 Essential (primary) hypertension: Secondary | ICD-10-CM

## 2021-04-29 DIAGNOSIS — K649 Unspecified hemorrhoids: Secondary | ICD-10-CM | POA: Diagnosis not present

## 2021-04-29 DIAGNOSIS — K644 Residual hemorrhoidal skin tags: Secondary | ICD-10-CM

## 2021-04-29 DIAGNOSIS — H903 Sensorineural hearing loss, bilateral: Secondary | ICD-10-CM

## 2021-04-29 DIAGNOSIS — I952 Hypotension due to drugs: Secondary | ICD-10-CM | POA: Diagnosis not present

## 2021-04-29 DIAGNOSIS — Z8719 Personal history of other diseases of the digestive system: Secondary | ICD-10-CM | POA: Diagnosis not present

## 2021-04-29 DIAGNOSIS — Z0000 Encounter for general adult medical examination without abnormal findings: Secondary | ICD-10-CM

## 2021-05-05 ENCOUNTER — Telehealth: Payer: Self-pay | Admitting: Internal Medicine

## 2021-05-05 NOTE — Telephone Encounter (Signed)
[name] [phone]  Leylany called to inquire about her receiving a cologaurd box in the mail, she stated she did not remember you guys talking about this. Do you think she needs to do this?

## 2021-05-06 ENCOUNTER — Other Ambulatory Visit: Payer: Self-pay | Admitting: Internal Medicine

## 2021-05-06 NOTE — Telephone Encounter (Signed)
Veronica Mcgrath called back and she is going to go ahead and do the cologaurd and send back.

## 2021-05-06 NOTE — Telephone Encounter (Signed)
LVM to CB to talk about Cologaurd

## 2021-05-13 ENCOUNTER — Ambulatory Visit: Payer: Medicare Other | Admitting: Internal Medicine

## 2021-05-26 DIAGNOSIS — Z23 Encounter for immunization: Secondary | ICD-10-CM | POA: Diagnosis not present

## 2021-06-02 NOTE — Patient Instructions (Signed)
It was a pleasure to see you today.  Please return Cologuard sample.  Have COVID update in the Fall.  Continue current medications and monitor blood pressure at home.  Return in 1 year or as needed.

## 2021-06-02 NOTE — Progress Notes (Signed)
Subjective:    Patient ID: RAFEEF LAU, female    DOB: 11/16/1934, 86 y.o.   MRN: [id number]  HPI 86 year old Female seen for health maintenance exam, Medicare wellness and evaluation of medical issues.  She has a history of hyperlipidemia and essential hypertension.  History of glaucoma and allergic rhinitis.  She tried statin medication, did not like how she felt on it, was concerned about side effects and discontinued it.  No known drug allergies.  History of some perirectal irritation treated with Anusol cream.  Because of family history of colon cancer, I asked her to do Cologuard testing.  However it never got done and we had put in another order for it.  She did see Dr. Watt Climes in November 2021 regarding some hemorrhoid tags and left lower quadrant pain.  He felt she had bleeding external hemorrhoids.  Admitted to some bright red blood per rectum with straining.  She did have left lower quadrant pain in October 2021.  Last colonoscopy was in 2011 showing hyperplastic polyp.  CT of abdomen and pelvis showed no intra-abdominal or pelvic pathology.  She had sigmoid diverticulosis and bilateral ovarian cyst.  Indeterminate right liver lesion was noted requiring MRI of the liver and this proved to be 3 small cavernous hemangiomas in the liver.  She does have age-related macular degeneration and is followed at [facility].  She has a history of hyperlipidemia (pure hypercholesterolemia) treated with Crestor 5 mg daily.  Lipid panel was completely normal and a year ago total cholesterol was 248 with an LDL cholesterol of 154.  She always has normal triglycerides.  HDL is now 45 and it was 69-year ago.  Dr. Watt Climes did colonoscopy in 2011 showing hyperplastic polyp.  Have suggested MiraLAX in the past for constipation.  She has had cataract extractions in both eyes around 2008.  Lumbar spine surgery 2010.  Social history: She is retired.  She is a widow.  She has a Clinical research associate.  Social alcohol consumption consisting of wine but does not consume it daily.  History of glaucoma treated by Dr. Ellie Lunch.  Had laser treatment for glaucoma by Dr. Baird Cancer.  Dr. Baird Cancer thought she might have thyroid eye disease.  I referred her to Dr. Loanne Drilling but he did not think she had thyroid disease.  In 2021 she was referred for sleep testing because she thought she had sleep apnea.  I do not see where she kept that appointment.  We also referred her to audiologist of choice for hearing issues.  Family history: Father died of colon cancer at age 51.  Mother died of stroke with history of diabetes mellitus at age 71.  1 sister in good health.  3 adult sons.  She has had normal mammogram in September 2021.  Review of Systems see above-confirmed that she will do Cologuard     Objective:   Physical Exam Vital signs reviewed.  Blood pressure 98/60 pulse 82 regular.  Weight 151 pounds height 5 feet 8 inches BMI 22.96  Skin: Warm and dry.  No cervical adenopathy or thyromegaly.  No carotid bruits.  Chest is clear.  Breasts are without masses.  Cardiac exam: Regular rate and rhythm without ectopy or murmur.  Abdomen is soft nondistended without hepatosplenomegaly masses or tenderness.  No lower extremity pitting edema.  Neuro is intact without focal deficits.  Affect thought and judgment are normal but she is slightly anxious.       Assessment & Plan:  Constipation-have recommended MiraLAX  Family history of colon cancer-have suggested she do Cologuard  Macular degeneration and glaucoma followed by ophthalmology  Essential hypertension treated with benazepril HCTZ and stable.  Her blood pressure is a bit low today.  She needs to stay well-hydrated and monitor to see if it is persistently low.  She is asymptomatic today.  Hyperlipidemia-treated with Crestor 5 mg daily and stable  Plan: Return in 6 to 12 months or as needed.   Subjective:   Patient presents for  Medicare Annual/Subsequent preventive examination.  Review Past Medical/Family/Social: See above   Risk Factors  Current exercise habits: Light Dietary issues discussed: Yes  Cardiac risk factors: Hyperlipidemia  Depression Screen  (Note: if answer to either of the following is "Yes", a more complete depression screening is indicated)   Over the past two weeks, have you felt down, depressed or hopeless? No  Over the past two weeks, have you felt little interest or pleasure in doing things? No Have you lost interest or pleasure in daily life? No Do you often feel hopeless? No Do you cry easily over simple problems? No   Activities of Daily Living  In your present state of health, do you have any difficulty performing the following activities?:   Driving? No  Managing money? No  Feeding yourself? No  Getting from bed to chair? No  Climbing a flight of stairs? No  Preparing food and eating?: No  Bathing or showering? No  Getting dressed: No  Getting to the toilet? No  Using the toilet:No  Moving around from place to place: No  In the past year have you fallen or had a near fall?:No  Are you sexually active? No  Do you have more than one partner? No   Hearing Difficulties: Yes Do you often ask people to speak up or repeat themselves?  Yes Do you experience ringing or noises in your ears? No  Do you have difficulty understanding soft or whispered voices? No  Do you feel that you have a problem with memory? No Do you often misplace items? No    Home Safety:  Do you have a smoke alarm at your residence? Yes Do you have grab bars in the bathroom?  Yes Do you have throw rugs in your house?  No   Cognitive Testing  Alert? Yes Normal Appearance?Yes  Oriented to person? Yes Place? Yes  Time? Yes  Recall of three objects? Yes  Can perform simple calculations? Yes  Displays appropriate judgment?Yes  Can read the correct time from a watch face?Yes   List the Names of  Other Physician/Practitioners you currently use:  See referral list for the physicians patient is currently seeing.  Ophthalmology   Review of Systems: See above   Objective:     General appearance: Appears stated age  and neat Head: Normocephalic, without obvious abnormality, atraumatic  Eyes: conj clear, EOMi PEERLA  Ears: normal TM's and external ear canals both ears  Nose: Nares normal. Septum midline. Mucosa normal. No drainage or sinus tenderness.  Throat: lips, mucosa, and tongue normal; teeth and gums normal  Neck: no adenopathy, no carotid bruit, no JVD, supple, symmetrical, trachea midline and thyroid not enlarged, symmetric, no tenderness/mass/nodules  No CVA tenderness.  Lungs: clear to auscultation bilaterally  Breasts: normal appearance, no masses or tenderness Heart: regular rate and rhythm, S1, S2 normal, no murmur, click, rub or gallop  Abdomen: soft, non-tender; bowel sounds normal; no masses, no organomegaly  Musculoskeletal: ROM normal  in all joints, no crepitus, no deformity, Normal muscle strengthen. Back  is symmetric, no curvature. Skin: Skin color, texture, turgor normal. No rashes or lesions  Lymph nodes: Cervical, supraclavicular, and axillary nodes normal.  Neurologic: CN 2 -12 Normal, Normal symmetric reflexes. Normal coordination and gait  Psych: Alert & Oriented x 3, Mood appear stable.    Assessment:    Annual wellness medicare exam   Plan:    During the course of the visit the patient was educated and counseled about appropriate screening and preventive services including:   Recommend COVID booster in the Fall     Patient Instructions (the written plan) was given to the patient.  Medicare Attestation  I have personally reviewed:  The patient's medical and social history  Their use of alcohol, tobacco or illicit drugs  Their current medications and supplements  The patient's functional ability including ADLs,fall risks, home safety  risks, cognitive, and hearing and visual impairment  Diet and physical activities  Evidence for depression or mood disorders  The patient's weight, height, BMI, and visual acuity have been recorded in the chart. I have made referrals, counseling, and provided education to the patient based on review of the above and I have provided the patient with a written personalized care plan for preventive services.

## 2021-06-30 ENCOUNTER — Ambulatory Visit: Payer: Medicare Other | Admitting: Internal Medicine

## 2021-06-30 DIAGNOSIS — H5213 Myopia, bilateral: Secondary | ICD-10-CM | POA: Diagnosis not present

## 2021-06-30 DIAGNOSIS — H353132 Nonexudative age-related macular degeneration, bilateral, intermediate dry stage: Secondary | ICD-10-CM | POA: Diagnosis not present

## 2021-06-30 DIAGNOSIS — H401122 Primary open-angle glaucoma, left eye, moderate stage: Secondary | ICD-10-CM | POA: Diagnosis not present

## 2021-06-30 DIAGNOSIS — D3131 Benign neoplasm of right choroid: Secondary | ICD-10-CM | POA: Diagnosis not present

## 2021-07-07 ENCOUNTER — Other Ambulatory Visit: Payer: Self-pay

## 2021-07-07 ENCOUNTER — Ambulatory Visit (INDEPENDENT_AMBULATORY_CARE_PROVIDER_SITE_OTHER): Payer: Medicare Other

## 2021-07-07 DIAGNOSIS — Z23 Encounter for immunization: Secondary | ICD-10-CM | POA: Diagnosis not present

## 2021-07-19 ENCOUNTER — Telehealth: Payer: Self-pay

## 2021-07-19 NOTE — Telephone Encounter (Signed)
Patients phone is busy x 4 times. I have LMOM at sons to have the patient call us. She has not returned her cologuard sample tot he company yet.

## 2021-09-17 DIAGNOSIS — H43393 Other vitreous opacities, bilateral: Secondary | ICD-10-CM | POA: Diagnosis not present

## 2021-09-17 DIAGNOSIS — H35373 Puckering of macula, bilateral: Secondary | ICD-10-CM | POA: Diagnosis not present

## 2021-09-17 DIAGNOSIS — H43813 Vitreous degeneration, bilateral: Secondary | ICD-10-CM | POA: Diagnosis not present

## 2021-09-17 DIAGNOSIS — H353132 Nonexudative age-related macular degeneration, bilateral, intermediate dry stage: Secondary | ICD-10-CM | POA: Diagnosis not present

## 2021-10-06 ENCOUNTER — Other Ambulatory Visit: Payer: Self-pay | Admitting: Internal Medicine

## 2022-01-04 DIAGNOSIS — H18593 Other hereditary corneal dystrophies, bilateral: Secondary | ICD-10-CM | POA: Diagnosis not present

## 2022-01-04 DIAGNOSIS — H401131 Primary open-angle glaucoma, bilateral, mild stage: Secondary | ICD-10-CM | POA: Diagnosis not present

## 2022-03-18 DIAGNOSIS — H353132 Nonexudative age-related macular degeneration, bilateral, intermediate dry stage: Secondary | ICD-10-CM | POA: Diagnosis not present

## 2022-04-07 ENCOUNTER — Other Ambulatory Visit: Payer: Self-pay | Admitting: Internal Medicine

## 2022-04-21 DIAGNOSIS — H10413 Chronic giant papillary conjunctivitis, bilateral: Secondary | ICD-10-CM | POA: Diagnosis not present

## 2022-04-26 DIAGNOSIS — H10413 Chronic giant papillary conjunctivitis, bilateral: Secondary | ICD-10-CM | POA: Diagnosis not present

## 2022-05-09 ENCOUNTER — Other Ambulatory Visit: Payer: Medicare Other

## 2022-05-09 DIAGNOSIS — I952 Hypotension due to drugs: Secondary | ICD-10-CM | POA: Diagnosis not present

## 2022-05-09 DIAGNOSIS — E7849 Other hyperlipidemia: Secondary | ICD-10-CM

## 2022-05-09 DIAGNOSIS — Z0000 Encounter for general adult medical examination without abnormal findings: Secondary | ICD-10-CM

## 2022-05-09 DIAGNOSIS — I10 Essential (primary) hypertension: Secondary | ICD-10-CM | POA: Diagnosis not present

## 2022-05-10 LAB — LIPID PANEL
Cholesterol: 192 mg/dL (ref <200)
HDL: 77 mg/dL (ref >=50)
LDL Cholesterol (Calc): 101 mg/dL (calc) — ABNORMAL HIGH
Non-HDL Cholesterol (Calc): 115 mg/dL (calc) (ref <130)
Total CHOL/HDL Ratio: 2.5 (calc) (ref <5.0)
Triglycerides: 62 mg/dL (ref <150)

## 2022-05-10 LAB — CBC WITH DIFFERENTIAL/PLATELET
Absolute Monocytes: 501 cells/uL (ref 200–950)
Basophils Absolute: 60 cells/uL (ref 0–200)
Basophils Relative: 1.3 %
Eosinophils Absolute: 101 cells/uL (ref 15–500)
Eosinophils Relative: 2.2 %
HCT: 46.3 % — ABNORMAL HIGH (ref 35.0–45.0)
Hemoglobin: 15.4 g/dL (ref 11.7–15.5)
Lymphs Abs: 1541 cells/uL (ref 850–3900)
MCH: 31 pg (ref 27.0–33.0)
MCHC: 33.3 g/dL (ref 32.0–36.0)
MCV: 93.2 fL (ref 80.0–100.0)
MPV: 10 fL (ref 7.5–12.5)
Monocytes Relative: 10.9 %
Neutro Abs: 2397 cells/uL (ref 1500–7800)
Neutrophils Relative %: 52.1 %
Platelets: 291 10*3/uL (ref 140–400)
RBC: 4.97 10*6/uL (ref 3.80–5.10)
RDW: 11.7 % (ref 11.0–15.0)
Total Lymphocyte: 33.5 %
WBC: 4.6 10*3/uL (ref 3.8–10.8)

## 2022-05-10 LAB — COMPLETE METABOLIC PANEL WITH GFR
AG Ratio: 1.4 (calc) (ref 1.0–2.5)
ALT: 13 U/L (ref 6–29)
AST: 16 U/L (ref 10–35)
Albumin: 3.9 g/dL (ref 3.6–5.1)
Alkaline phosphatase (APISO): 46 U/L (ref 37–153)
BUN: 12 mg/dL (ref 7–25)
CO2: 26 mmol/L (ref 20–32)
Calcium: 9.3 mg/dL (ref 8.6–10.4)
Chloride: 102 mmol/L (ref 98–110)
Creat: 0.9 mg/dL (ref 0.60–0.95)
Globulin: 2.7 g/dL (calc) (ref 1.9–3.7)
Glucose, Bld: 86 mg/dL (ref 65–99)
Potassium: 4.8 mmol/L (ref 3.5–5.3)
Sodium: 140 mmol/L (ref 135–146)
Total Bilirubin: 0.7 mg/dL (ref 0.2–1.2)
Total Protein: 6.6 g/dL (ref 6.1–8.1)
eGFR: 63 mL/min/{1.73_m2} (ref >=60)

## 2022-05-10 LAB — TSH: TSH: 4.21 mIU/L (ref 0.40–4.50)

## 2022-05-12 ENCOUNTER — Encounter: Payer: Self-pay | Admitting: Internal Medicine

## 2022-05-12 ENCOUNTER — Ambulatory Visit (INDEPENDENT_AMBULATORY_CARE_PROVIDER_SITE_OTHER): Payer: Medicare Other | Admitting: Internal Medicine

## 2022-05-12 ENCOUNTER — Other Ambulatory Visit: Payer: Self-pay | Admitting: Internal Medicine

## 2022-05-12 VITALS — BP 134/82 | HR 80 | Temp 98.9°F | Ht 68.0 in | Wt 139.1 lb

## 2022-05-12 DIAGNOSIS — H903 Sensorineural hearing loss, bilateral: Secondary | ICD-10-CM

## 2022-05-12 DIAGNOSIS — E7800 Pure hypercholesterolemia, unspecified: Secondary | ICD-10-CM | POA: Diagnosis not present

## 2022-05-12 DIAGNOSIS — Z0000 Encounter for general adult medical examination without abnormal findings: Secondary | ICD-10-CM

## 2022-05-12 DIAGNOSIS — Z800 Family history of malignant neoplasm of digestive organs: Secondary | ICD-10-CM

## 2022-05-12 DIAGNOSIS — Z23 Encounter for immunization: Secondary | ICD-10-CM | POA: Diagnosis not present

## 2022-05-12 DIAGNOSIS — Z1231 Encounter for screening mammogram for malignant neoplasm of breast: Secondary | ICD-10-CM

## 2022-05-12 DIAGNOSIS — H3530 Unspecified macular degeneration: Secondary | ICD-10-CM | POA: Diagnosis not present

## 2022-05-12 DIAGNOSIS — Z8719 Personal history of other diseases of the digestive system: Secondary | ICD-10-CM

## 2022-05-12 DIAGNOSIS — K644 Residual hemorrhoidal skin tags: Secondary | ICD-10-CM

## 2022-05-12 DIAGNOSIS — I10 Essential (primary) hypertension: Secondary | ICD-10-CM

## 2022-05-12 LAB — HEMOCCULT GUIAC POC 1CARD (OFFICE): Fecal Occult Blood, POC: NEGATIVE

## 2022-05-12 LAB — POCT URINALYSIS DIPSTICK
Bilirubin, UA: NEGATIVE
Blood, UA: NEGATIVE
Glucose, UA: NEGATIVE
Ketones, UA: NEGATIVE
Leukocytes, UA: NEGATIVE
Nitrite, UA: NEGATIVE
Protein, UA: NEGATIVE
Spec Grav, UA: 1.015 (ref 1.010–1.025)
Urobilinogen, UA: 0.2 E.U./dL
pH, UA: 5 (ref 5.0–8.0)

## 2022-05-12 MED ORDER — ROSUVASTATIN CALCIUM 5 MG PO TABS: 5.0000 mg | ORAL_TABLET | Freq: Every day | ORAL | 3 refills | Status: DC

## 2022-05-12 NOTE — Patient Instructions (Addendum)
Please send Cologard in to be processed. Vaccines have been discussed.  Continue current medications.  It was a pleasure to see you today.  Rosuvastatin refilled.

## 2022-05-12 NOTE — Progress Notes (Deleted)
   Subjective:    Patient ID: Veronica Mcgrath, female    DOB: [birth year], 87 y.o.   MRN: [id number]  HPI  87 year old Female     Review of Systems     Objective:   Physical Exam        Assessment & Plan:

## 2022-05-12 NOTE — Progress Notes (Signed)
Annual Wellness Visit     Patient: Veronica Mcgrath, Female    DOB: [birth year], 87 y.o.   MRN: [id number] Visit Date: 05/12/2022  Chief Complaint  Patient presents with   [facility] Wellness   Annual Exam   Subjective    Veronica Mcgrath is a 87 y.o. female who presents today for her Annual Wellness Visit.  HPI: 87 year old Female seen for health maintenance exam and evaluation of medical issues.  History of pure hypercholesterolemia and essential hypertension.  History of glaucoma and allergic rhinitis.  She is on statin medication 5 mg daily rosuvastatin and tolerates that fine without side effects.  History of perirectal irritation with minor hemorrhoids treated with Anusol cream.  Dr. Teena Dunk and his GI physician.  She saw him in November 2021 for some hemorrhoidal tags and left lower quadrant pain.  He felt she had bleeding external hemorrhoids.  She did have colonoscopy in 2011 showing a hyperplastic polyp.  CT of the abdomen and pelvis showed no intra-abdominal or pelvic pathology.  She had sigmoid diverticulosis and a bilateral ovarian cyst.  She had a indeterminate right liver lesion noted which required an MRI of the liver to determine that this was actually 3 small cavernous hemangiomas in the liver.  We ordered Cologuard for her a while back and she needs to complete that study.  MiraLAX was recommended for constipation by Dr. Watt Climes.  She had cataract extractions both eyes around 2008.  Lumbar spine surgery 2010.  History of age-related macular degeneration followed at [facility] ophthalmology.  Glaucoma is treated by Dr. Ellie Lunch.  She had laser treatment for glaucoma by Dr. Baird Cancer.  Dr. Baird Cancer thought she might have thyroid eye disease.  I referred her to Dr. Loanne Drilling and he did not think she had thyroid disease.  In 2021, she was referred for sleep testing because she felt she had sleep apnea.  I do not think she ever actually had that study.  She also was referred to  audiologist of her choice for hearing issues.  Mammogram done 2021.  I have placed an order for future Mammogram at [facility] today.  History of hyperlipidemia (pure hypercholesterolemia) treated with Crestor 5 mg daily.  She has always had normal triglycerides.  Has good HDL cholesterol value of 77.  LDL stable at 101.  She does a lot of exercising outdoors with gardening several hours daily.  Social history: She is retired.  She has a Midwife.  Social alcohol consumption consisting of wine but does not consume it daily.  She has a Education officer, community.  Family history: Father died of colon cancer at age 80.  Mother died of stroke with history of diabetes and age 72.  1 sister in good health.  3 adult sons.          Review of Systems see above   Objective    Vitals: BP 134/82   Pulse 80   Temp 98.9 F (37.2 C) (Tympanic)   Ht '5\' 8"'$  (1.727 m)   Wt 139 lb 1.9 oz (63.1 kg)   SpO2 98%   BMI 21.15 kg/m   Physical Exam Skin: Warm and dry.  Nodes none.  TMs clear.  Pharynx is clear.  Neck is supple.  Chest clear.  No carotid bruits.  Cardiac exam: Regular rate and rhythm without ectopy.  Breasts are without masses.  Abdomen: No hepatosplenomegaly masses or tenderness.  She has minor hemorrhoid tags externally.  No  rectal bleeding.  No lower extremity pitting edema.  Stool is guaiac negative.  Brief neurological exam is intact without gross focal deficits.  Affect thought and judgment appear to be normal.     Most recent functional status assessment:    05/12/2022   10:05 AM  In your present state of health, do you have any difficulty performing the following activities:  Hearing? 1  Vision? 1  Difficulty concentrating or making decisions? 0  Walking or climbing stairs? 0  Dressing or bathing? 0  Doing errands, shopping? 0  Preparing Food and eating ? N  Using the Toilet? N  In the past six months, have you accidently leaked urine? Y  Do you have  problems with loss of bowel control? N  Managing your Medications? N  Managing your Finances? N  Housekeeping or managing your Housekeeping? N   Most recent fall risk assessment:    05/12/2022   10:04 AM  Fall Risk   Falls in the past year? 0  Number falls in past yr: 0  Injury with Fall? 0  Risk for fall due to : No Fall Risks  Follow up Falls evaluation completed    Most recent depression screenings:    05/12/2022   10:04 AM 04/29/2021   10:12 AM  PHQ 2/9 Scores  PHQ - 2 Score 0 0   Most recent cognitive screening:    05/12/2022   10:08 AM  6CIT Screen  What Year? 0 points  What month? 0 points  What time? 0 points  Count back from 20 0 points  Months in reverse 0 points  Repeat phrase 0 points  Total Score 0 points       Assessment & Plan   She has some minor external hemorrhoidal tags that are not swollen red or inflamed.  May use Anusol as needed.  Vaccines discussed at length and recommendations made.  She will send in Cologuard to be processed.  New current medications.  Follow-up in 1 year or as needed.      Annual wellness visit done today including the all of the following: Reviewed patient's Family Medical History Reviewed and updated list of patient's medical providers Assessment of cognitive impairment was done Assessed patient's functional ability Established a written schedule for health screening [place] Completed and Reviewed  Discussed health benefits of physical activity, and encouraged her to engage in regular exercise appropriate for her age and condition.         IElby Showers, MD, have reviewed all documentation for this visit. The documentation on 05/12/22 for the exam, diagnosis, procedures, and orders are all accurate and complete.   LaVon Barron Alvine, CMA

## 2022-05-29 DIAGNOSIS — Z23 Encounter for immunization: Secondary | ICD-10-CM | POA: Diagnosis not present

## 2022-06-07 DIAGNOSIS — H401122 Primary open-angle glaucoma, left eye, moderate stage: Secondary | ICD-10-CM | POA: Diagnosis not present

## 2022-06-07 DIAGNOSIS — Z961 Presence of intraocular lens: Secondary | ICD-10-CM | POA: Diagnosis not present

## 2022-06-07 DIAGNOSIS — H401111 Primary open-angle glaucoma, right eye, mild stage: Secondary | ICD-10-CM | POA: Diagnosis not present

## 2022-06-07 DIAGNOSIS — H52203 Unspecified astigmatism, bilateral: Secondary | ICD-10-CM | POA: Diagnosis not present

## 2022-06-08 ENCOUNTER — Ambulatory Visit
Admission: RE | Admit: 2022-06-08 | Discharge: 2022-06-08 | Disposition: A | Discharge status: Home / Self Care | Payer: Medicare Other | Source: Ambulatory Visit | Attending: Internal Medicine | Admitting: Internal Medicine

## 2022-06-08 DIAGNOSIS — Z1231 Encounter for screening mammogram for malignant neoplasm of breast: Secondary | ICD-10-CM

## 2022-06-14 DIAGNOSIS — Z23 Encounter for immunization: Secondary | ICD-10-CM | POA: Diagnosis not present

## 2022-07-03 ENCOUNTER — Other Ambulatory Visit: Payer: Self-pay | Admitting: Internal Medicine

## 2022-09-16 DIAGNOSIS — H43393 Other vitreous opacities, bilateral: Secondary | ICD-10-CM | POA: Diagnosis not present

## 2022-09-16 DIAGNOSIS — H353132 Nonexudative age-related macular degeneration, bilateral, intermediate dry stage: Secondary | ICD-10-CM | POA: Diagnosis not present

## 2022-09-16 DIAGNOSIS — H35373 Puckering of macula, bilateral: Secondary | ICD-10-CM | POA: Diagnosis not present

## 2022-09-16 DIAGNOSIS — H43813 Vitreous degeneration, bilateral: Secondary | ICD-10-CM | POA: Diagnosis not present

## 2022-09-16 DIAGNOSIS — H401132 Primary open-angle glaucoma, bilateral, moderate stage: Secondary | ICD-10-CM | POA: Diagnosis not present

## 2022-09-29 ENCOUNTER — Other Ambulatory Visit: Payer: Self-pay | Admitting: Internal Medicine

## 2022-12-13 DIAGNOSIS — H401122 Primary open-angle glaucoma, left eye, moderate stage: Secondary | ICD-10-CM | POA: Diagnosis not present

## 2022-12-13 DIAGNOSIS — H401111 Primary open-angle glaucoma, right eye, mild stage: Secondary | ICD-10-CM | POA: Diagnosis not present

## 2022-12-13 DIAGNOSIS — H353132 Nonexudative age-related macular degeneration, bilateral, intermediate dry stage: Secondary | ICD-10-CM | POA: Diagnosis not present

## 2022-12-21 DIAGNOSIS — H401122 Primary open-angle glaucoma, left eye, moderate stage: Secondary | ICD-10-CM | POA: Diagnosis not present

## 2022-12-28 DIAGNOSIS — H401111 Primary open-angle glaucoma, right eye, mild stage: Secondary | ICD-10-CM | POA: Diagnosis not present

## 2023-01-03 ENCOUNTER — Other Ambulatory Visit: Payer: Self-pay | Admitting: Internal Medicine

## 2023-02-24 DIAGNOSIS — T148XXA Other injury of unspecified body region, initial encounter: Secondary | ICD-10-CM | POA: Diagnosis not present

## 2023-02-24 DIAGNOSIS — L218 Other seborrheic dermatitis: Secondary | ICD-10-CM | POA: Diagnosis not present

## 2023-03-22 DIAGNOSIS — H35373 Puckering of macula, bilateral: Secondary | ICD-10-CM | POA: Diagnosis not present

## 2023-03-22 DIAGNOSIS — H43393 Other vitreous opacities, bilateral: Secondary | ICD-10-CM | POA: Diagnosis not present

## 2023-03-22 DIAGNOSIS — H353132 Nonexudative age-related macular degeneration, bilateral, intermediate dry stage: Secondary | ICD-10-CM | POA: Diagnosis not present

## 2023-03-22 DIAGNOSIS — D3131 Benign neoplasm of right choroid: Secondary | ICD-10-CM | POA: Diagnosis not present

## 2023-03-22 DIAGNOSIS — H401132 Primary open-angle glaucoma, bilateral, moderate stage: Secondary | ICD-10-CM | POA: Diagnosis not present

## 2023-03-22 DIAGNOSIS — H43813 Vitreous degeneration, bilateral: Secondary | ICD-10-CM | POA: Diagnosis not present

## 2023-03-24 DIAGNOSIS — L218 Other seborrheic dermatitis: Secondary | ICD-10-CM | POA: Diagnosis not present

## 2023-04-12 ENCOUNTER — Telehealth: Payer: Self-pay | Admitting: Internal Medicine

## 2023-04-12 ENCOUNTER — Encounter: Payer: Self-pay | Admitting: Internal Medicine

## 2023-04-12 MED ORDER — BENAZEPRIL HCL 20 MG PO TABS: 20.0000 mg | ORAL_TABLET | Freq: Every day | ORAL | 0 refills | Status: DC

## 2023-04-12 NOTE — Telephone Encounter (Addendum)
Received Fax RX request from  Pharmacy -  [facility] [id number] [place], [place] - [address] Phone: [phone]  Fax: [phone]      Medication - Benazepril HCL 20 Mg tablet Quantity 90 -- Take 1 tablet by mouth daily  Last Refill - 01/03/2023  Last OV - 05/12/2022  Last CPE - 05/12/2022  Next Appointment - 05/18/2023   I have refilled this for 90 days. MJB, MD Pt has appt in September here.

## 2023-05-11 NOTE — Progress Notes (Addendum)
Annual Wellness Visit    Patient Care Team: Lenda Baratta, Luanna Cole, MD as PCP - General (Internal Medicine) Veronica Harder, MD (Inactive) as Consulting Physician (Dermatology)  Visit Date: 05/18/23   Chief Complaint  Patient presents with   Annual Exam    Subjective:   Patient: Veronica Mcgrath, Female    DOB: June 29, 1935, 88 y.o.   MRN: [id number]  Veronica Mcgrath is a 88 y.o. Female who presents today for her Annual Wellness Visit. History of glaucoma, hypercholesterolemia, essential hypertension, allergic rhinitis.  History of hyperlipidemia treated with rosuvastatin 5 mg daily. Lipid panel normal.  History of hypertension treated with hydrochlorothiazide 25 mg daily, benazepril 20 mg daily. Blood pressure normal today at 120/78.  History of perirectal irritation with minor hemorrhoids treated with Anusol cream.  Dr. Ewing Schlein is her GI physician.  She saw him in November 2021 for some hemorrhoidal tags and left lower quadrant pain.  He felt she had bleeding external hemorrhoids.  She did have colonoscopy in 2011 showing a hyperplastic polyp.  CT of the abdomen and pelvis showed no intra-abdominal or pelvic pathology.  She had sigmoid diverticulosis and a bilateral ovarian cyst.  She had a indeterminate right liver lesion noted which required an MRI of the liver to determine that this was actually 3 small cavernous hemangiomas in the liver.  2022 Cologuard order has expired.  MiraLAX was recommended for constipation by Dr. Ewing Schlein.   She had cataract extractions both eyes around 2008.  Lumbar spine surgery 2010.   History of age-related macular degeneration followed at [facility] ophthalmology.  Glaucoma is treated by Dr. Charlotte Sanes.  She had laser treatment for glaucoma by Dr. Allyne Gee.  Dr. Allyne Gee thought she might have thyroid eye disease.  I referred her to Dr. Everardo All and he did not think she had thyroid disease.   In 2021, she was referred for sleep testing because she felt she had sleep  apnea.  I do not think she ever actually had that study.  She has bilateral hearing loss.  She spends a large amount of time gardening.  Glucose normal. Kidney, liver functions normal. Electrolytes normal. Blood proteins normal. HCT elevated at 45.8. TSH at 3.02. Her energy levels are normal.   Mammogram last completed 06/08/22. No mammographic evidence of malignancy.   Social history: She is retired. Social alcohol consumption consisting of wine but does not consume it daily.  She has a Education officer, community.   Family history: Father died of colon cancer at age 75.  Mother died of stroke with history of diabetes at age 38.  1 sister in good health.  3 adult sons.  Past Medical History:  Diagnosis Date   Glaucoma    Hyperlipidemia    Hypertension      Family History  Problem Relation Age of Onset   Diabetes Mother    Stroke Mother    Cancer Father    Thyroid disease Neg Hx    Breast cancer Neg Hx      Social History   Social History Narrative   Not on file     Review of Systems  Constitutional:  Negative for chills, fever, malaise/fatigue and weight loss.  HENT:  Negative for hearing loss, sinus pain and sore throat.   Respiratory:  Negative for cough, hemoptysis and shortness of breath.   Cardiovascular:  Negative for chest pain, palpitations, leg swelling and PND.  Gastrointestinal:  Negative for abdominal pain, constipation, diarrhea, heartburn, nausea and vomiting.  Genitourinary:  Negative for dysuria, frequency and urgency.  Musculoskeletal:  Negative for back pain, myalgias and neck pain.  Skin:  Negative for itching and rash.  Neurological:  Negative for dizziness, tingling, seizures and headaches.  Endo/Heme/Allergies:  Negative for polydipsia.  Psychiatric/Behavioral:  Negative for depression. The patient is not nervous/anxious.       Objective:   Vitals: BP 120/78 (BP Location: Left Arm, Patient Position: Sitting, Cuff Size: Large)   Pulse 83   Temp  98.6 F (37 C) (Oral)   Ht 5\' 8"  (1.727 m)   Wt 139 lb (63 kg)   SpO2 97%   BMI 21.13 kg/m   Physical Exam Vitals and nursing note reviewed.  Constitutional:      General: She is not in acute distress.    Appearance: Normal appearance. She is not ill-appearing or toxic-appearing.  HENT:     Head: Normocephalic and atraumatic.     Right Ear: Hearing, tympanic membrane, ear canal and external ear normal.     Left Ear: Hearing, tympanic membrane, ear canal and external ear normal.     Mouth/Throat:     Pharynx: Oropharynx is clear.  Eyes:     Extraocular Movements: Extraocular movements intact.     Pupils: Pupils are equal, round, and reactive to light.  Neck:     Thyroid: No thyroid mass, thyromegaly or thyroid tenderness.     Vascular: No carotid bruit.  Cardiovascular:     Rate and Rhythm: Normal rate and regular rhythm. No extrasystoles are present.    Pulses:          Dorsalis pedis pulses are 1+ on the right side and 1+ on the left side.     Heart sounds: Normal heart sounds. No murmur heard.    No friction rub. No gallop.  Pulmonary:     Effort: Pulmonary effort is normal.     Breath sounds: Normal breath sounds. No decreased breath sounds, wheezing, rhonchi or rales.  Chest:     Chest wall: No mass.  Abdominal:     Palpations: Abdomen is soft. There is no hepatomegaly, splenomegaly or mass.     Tenderness: There is no abdominal tenderness.     Hernia: No hernia is present.  Genitourinary:    Comments: Bimanual exam normal. Musculoskeletal:     Cervical back: Normal range of motion.     Right lower leg: No edema.     Left lower leg: No edema.  Lymphadenopathy:     Cervical: No cervical adenopathy.     Upper Body:     Right upper body: No supraclavicular adenopathy.     Left upper body: No supraclavicular adenopathy.  Skin:    General: Skin is warm and dry.  Neurological:     General: No focal deficit present.     Mental Status: She is alert and oriented to  person, place, and time. Mental status is at baseline.     Sensory: Sensation is intact.     Motor: Motor function is intact. No weakness.     Deep Tendon Reflexes: Reflexes are normal and symmetric.  Psychiatric:        Attention and Perception: Attention normal.        Mood and Affect: Mood normal.        Speech: Speech normal.        Behavior: Behavior normal.        Thought Content: Thought content normal.  Cognition and Memory: Cognition normal.        Judgment: Judgment normal.      Most recent functional status assessment:    05/18/2023   10:00 AM  In your present state of health, do you have any difficulty performing the following activities:  Hearing? 0  Vision? 0  Walking or climbing stairs? 0  Dressing or bathing? 0  Doing errands, shopping? 0  Preparing Food and eating ? N  Using the Toilet? N  In the past six months, have you accidently leaked urine? N  Do you have problems with loss of bowel control? N  Managing your Medications? N  Managing your Finances? N  Housekeeping or managing your Housekeeping? N   Most recent fall risk assessment:    05/18/2023   10:03 AM  Fall Risk   Falls in the past year? 0  Number falls in past yr: 0  Injury with Fall? 0  Risk for fall due to : No Fall Risks  Follow up Falls evaluation completed    Most recent depression screenings:    05/18/2023   10:03 AM 05/18/2023    9:57 AM  PHQ 2/9 Scores  PHQ - 2 Score 0 0   Most recent cognitive screening:    05/12/2022   10:08 AM  6CIT Screen  What Year? 0 points  What month? 0 points  What time? 0 points  Count back from 20 0 points  Months in reverse 0 points  Repeat phrase 0 points  Total Score 0 points     Results:   Studies obtained and personally reviewed by me:  Mammogram last completed 06/08/22. No mammographic evidence of malignancy.   Labs:       Component Value Date/Time   NA 137 05/16/2023 0916   K 4.4 05/16/2023 0916   CL 99 05/16/2023 0916    CO2 28 05/16/2023 0916   GLUCOSE 91 05/16/2023 0916   BUN 16 05/16/2023 0916   CREATININE 0.76 05/16/2023 0916   CALCIUM 9.6 05/16/2023 0916   PROT 6.7 05/16/2023 0916   ALBUMIN 4.2 04/14/2017 0929   AST 13 05/16/2023 0916   ALT 13 05/16/2023 0916   ALKPHOS 45 04/14/2017 0929   BILITOT 0.6 05/16/2023 0916   GFRNONAA 63 04/24/2020 1128   GFRAA 73 04/24/2020 1128     Lab Results  Component Value Date   WBC 5.4 05/16/2023   HGB 15.2 05/16/2023   HCT 45.8 (H) 05/16/2023   MCV 91.6 05/16/2023   PLT 280 05/16/2023    Lab Results  Component Value Date   CHOL 179 05/16/2023   HDL 88 05/16/2023   LDLCALC 78 05/16/2023   TRIG 54 05/16/2023   CHOLHDL 2.0 05/16/2023    No results found for: "HGBA1C"   Lab Results  Component Value Date   TSH 3.02 05/16/2023    Assessment & Plan:   Hyperlipidemia: treated with rosuvastatin 5 mg daily. Lipid panel normal.  Hypertension: treated with hydrochlorothiazide 25 mg daily, benazepril 20 mg daily. Blood pressure normal today at 120/78.   Age-related macular degeneration: followed at [facility] ophthalmology.    Glaucoma: management per Dr. Charlotte Sanes. She had laser treatment by Dr. Allyne Gee.    Mammogram last completed 06/08/22. No mammographic evidence of malignancy.   Vaccine counseling: she will go to the pharmacy for tetanus, flu updates. Reports she received a Covid-19 booster at the pharmacy.   Return in 1 year or as needed.     Annual wellness visit done  today including the all of the following: Reviewed patient's Family Medical History Reviewed and updated list of patient's medical providers Assessment of cognitive impairment was done Assessed patient's functional ability Established a written schedule for health screening services Health Risk Assessent Completed and Reviewed  Discussed health benefits of physical activity, and encouraged her to engage in regular exercise appropriate for her age and condition.         I,Alexander Ruley,acting as a Neurosurgeon for Margaree Mackintosh, MD.,have documented all relevant documentation on the behalf of Margaree Mackintosh, MD,as directed by  Margaree Mackintosh, MD while in the presence of Margaree Mackintosh, MD.   I, Margaree Mackintosh, MD, have reviewed all documentation for this visit. The documentation on 05/22/23 for the exam, diagnosis, procedures, and orders are all accurate and complete.

## 2023-05-16 ENCOUNTER — Other Ambulatory Visit: Payer: Medicare Other

## 2023-05-16 DIAGNOSIS — Z0000 Encounter for general adult medical examination without abnormal findings: Secondary | ICD-10-CM

## 2023-05-16 DIAGNOSIS — E7800 Pure hypercholesterolemia, unspecified: Secondary | ICD-10-CM | POA: Diagnosis not present

## 2023-05-16 DIAGNOSIS — I10 Essential (primary) hypertension: Secondary | ICD-10-CM | POA: Diagnosis not present

## 2023-05-16 DIAGNOSIS — Z1329 Encounter for screening for other suspected endocrine disorder: Secondary | ICD-10-CM

## 2023-05-17 LAB — CBC WITH DIFFERENTIAL/PLATELET
Absolute Monocytes: 578 {cells}/uL (ref 200–950)
Basophils Absolute: 70 {cells}/uL (ref 0–200)
Basophils Relative: 1.3 %
Eosinophils Absolute: 97 {cells}/uL (ref 15–500)
Eosinophils Relative: 1.8 %
HCT: 45.8 % — ABNORMAL HIGH (ref 35.0–45.0)
Hemoglobin: 15.2 g/dL (ref 11.7–15.5)
Lymphs Abs: 1447 {cells}/uL (ref 850–3900)
MCH: 30.4 pg (ref 27.0–33.0)
MCHC: 33.2 g/dL (ref 32.0–36.0)
MCV: 91.6 fL (ref 80.0–100.0)
MPV: 9.8 fL (ref 7.5–12.5)
Monocytes Relative: 10.7 %
Neutro Abs: 3208 {cells}/uL (ref 1500–7800)
Neutrophils Relative %: 59.4 %
Platelets: 280 10*3/uL (ref 140–400)
RBC: 5 10*6/uL (ref 3.80–5.10)
RDW: 12.4 % (ref 11.0–15.0)
Total Lymphocyte: 26.8 %
WBC: 5.4 10*3/uL (ref 3.8–10.8)

## 2023-05-17 LAB — COMPLETE METABOLIC PANEL WITH GFR
AG Ratio: 1.6 (calc) (ref 1.0–2.5)
ALT: 13 U/L (ref 6–29)
AST: 13 U/L (ref 10–35)
Albumin: 4.1 g/dL (ref 3.6–5.1)
Alkaline phosphatase (APISO): 46 U/L (ref 37–153)
BUN: 16 mg/dL (ref 7–25)
CO2: 28 mmol/L (ref 20–32)
Calcium: 9.6 mg/dL (ref 8.6–10.4)
Chloride: 99 mmol/L (ref 98–110)
Creat: 0.76 mg/dL (ref 0.60–0.95)
Globulin: 2.6 g/dL (ref 1.9–3.7)
Glucose, Bld: 91 mg/dL (ref 65–99)
Potassium: 4.4 mmol/L (ref 3.5–5.3)
Sodium: 137 mmol/L (ref 135–146)
Total Bilirubin: 0.6 mg/dL (ref 0.2–1.2)
Total Protein: 6.7 g/dL (ref 6.1–8.1)
eGFR: 76 mL/min/{1.73_m2} (ref >=60)

## 2023-05-17 LAB — LIPID PANEL
Cholesterol: 179 mg/dL (ref <200)
HDL: 88 mg/dL (ref >=50)
LDL Cholesterol (Calc): 78 mg/dL
Non-HDL Cholesterol (Calc): 91 mg/dL (ref <130)
Total CHOL/HDL Ratio: 2 (calc) (ref <5.0)
Triglycerides: 54 mg/dL (ref <150)

## 2023-05-17 LAB — TSH: TSH: 3.02 m[IU]/L (ref 0.40–4.50)

## 2023-05-18 ENCOUNTER — Encounter: Payer: Self-pay | Admitting: Internal Medicine

## 2023-05-18 ENCOUNTER — Ambulatory Visit (INDEPENDENT_AMBULATORY_CARE_PROVIDER_SITE_OTHER): Payer: Medicare Other | Admitting: Internal Medicine

## 2023-05-18 VITALS — BP 120/78 | HR 83 | Temp 98.6°F | Ht 68.0 in | Wt 139.0 lb

## 2023-05-18 DIAGNOSIS — H903 Sensorineural hearing loss, bilateral: Secondary | ICD-10-CM

## 2023-05-18 DIAGNOSIS — Z23 Encounter for immunization: Secondary | ICD-10-CM

## 2023-05-18 DIAGNOSIS — I10 Essential (primary) hypertension: Secondary | ICD-10-CM | POA: Diagnosis not present

## 2023-05-18 DIAGNOSIS — K5901 Slow transit constipation: Secondary | ICD-10-CM | POA: Insufficient documentation

## 2023-05-18 DIAGNOSIS — Z0000 Encounter for general adult medical examination without abnormal findings: Secondary | ICD-10-CM | POA: Diagnosis not present

## 2023-05-18 DIAGNOSIS — E7800 Pure hypercholesterolemia, unspecified: Secondary | ICD-10-CM

## 2023-05-18 DIAGNOSIS — K644 Residual hemorrhoidal skin tags: Secondary | ICD-10-CM | POA: Diagnosis not present

## 2023-05-18 DIAGNOSIS — K921 Melena: Secondary | ICD-10-CM | POA: Insufficient documentation

## 2023-05-18 DIAGNOSIS — K922 Gastrointestinal hemorrhage, unspecified: Secondary | ICD-10-CM | POA: Insufficient documentation

## 2023-05-18 DIAGNOSIS — Z86010 Personal history of colonic polyps: Secondary | ICD-10-CM | POA: Insufficient documentation

## 2023-05-18 DIAGNOSIS — Z8719 Personal history of other diseases of the digestive system: Secondary | ICD-10-CM | POA: Diagnosis not present

## 2023-05-18 DIAGNOSIS — R1032 Left lower quadrant pain: Secondary | ICD-10-CM | POA: Insufficient documentation

## 2023-05-18 DIAGNOSIS — H3530 Unspecified macular degeneration: Secondary | ICD-10-CM | POA: Diagnosis not present

## 2023-05-18 DIAGNOSIS — Z800 Family history of malignant neoplasm of digestive organs: Secondary | ICD-10-CM | POA: Insufficient documentation

## 2023-05-19 ENCOUNTER — Emergency Department (HOSPITAL_COMMUNITY): Payer: Medicare Other

## 2023-05-19 ENCOUNTER — Other Ambulatory Visit: Payer: Self-pay

## 2023-05-19 ENCOUNTER — Encounter (HOSPITAL_COMMUNITY): Payer: Self-pay | Admitting: Emergency Medicine

## 2023-05-19 ENCOUNTER — Emergency Department (HOSPITAL_COMMUNITY)
Admission: EM | Admit: 2023-05-19 | Discharge: 2023-05-19 | Disposition: A | Discharge status: Home / Self Care | Payer: Medicare Other | Attending: Emergency Medicine | Admitting: Emergency Medicine

## 2023-05-19 DIAGNOSIS — S8002XA Contusion of left knee, initial encounter: Secondary | ICD-10-CM | POA: Insufficient documentation

## 2023-05-19 DIAGNOSIS — S060X0A Concussion without loss of consciousness, initial encounter: Secondary | ICD-10-CM | POA: Diagnosis not present

## 2023-05-19 DIAGNOSIS — S0083XA Contusion of other part of head, initial encounter: Secondary | ICD-10-CM | POA: Diagnosis not present

## 2023-05-19 DIAGNOSIS — S8001XA Contusion of right knee, initial encounter: Secondary | ICD-10-CM | POA: Diagnosis not present

## 2023-05-19 DIAGNOSIS — Y9301 Activity, walking, marching and hiking: Secondary | ICD-10-CM | POA: Diagnosis not present

## 2023-05-19 DIAGNOSIS — Y92512 Supermarket, store or market as the place of occurrence of the external cause: Secondary | ICD-10-CM | POA: Insufficient documentation

## 2023-05-19 DIAGNOSIS — S0990XA Unspecified injury of head, initial encounter: Secondary | ICD-10-CM | POA: Diagnosis not present

## 2023-05-19 DIAGNOSIS — W19XXXA Unspecified fall, initial encounter: Secondary | ICD-10-CM | POA: Insufficient documentation

## 2023-05-19 DIAGNOSIS — I6782 Cerebral ischemia: Secondary | ICD-10-CM | POA: Diagnosis not present

## 2023-05-19 NOTE — ED Notes (Signed)
Unable to get temp due to pt couldn't  keep mouth close

## 2023-05-19 NOTE — ED Provider Notes (Signed)
[place] EMERGENCY DEPARTMENT AT [facility] Provider Note   CSN: [id number] Arrival date & time: 05/19/23  [id number]     History {Add pertinent medical, surgical, social history, OB history to HPI:1} Chief Complaint  Patient presents with   Veronica Mcgrath is a 88 y.o. female.  HPI    88 year old female comes in with chief complaint of fall Home Medications Prior to Admission medications   Medication Sig Start Date End Date Taking? Authorizing Provider  benazepril (LOTENSIN) 20 MG tablet Take 1 tablet (20 mg total) by mouth daily. 04/12/23   Margaree Mackintosh, MD  hydrochlorothiazide (HYDRODIURIL) 25 MG tablet TAKE ONE TABLET BY MOUTH DAILY 07/03/22   Margaree Mackintosh, MD  rosuvastatin (CRESTOR) 5 MG tablet Take 1 tablet (5 mg total) by mouth daily. 05/12/22   Margaree Mackintosh, MD      Allergies    Patient has no known allergies.    Review of Systems   Review of Systems  Physical Exam Updated Vital Signs BP 133/62 (BP Location: Right Arm)   Pulse 77   Temp 97.9 F (36.6 C) (Oral)   Resp 18   SpO2 100%  Physical Exam  ED Results / Procedures / Treatments   Labs (all labs ordered are listed, but only abnormal results are displayed) Labs Reviewed - No data to display  EKG None  Radiology CT Head Wo Contrast  Result Date: 05/19/2023 CLINICAL DATA:  Provided history: Head trauma, minor. Fall yesterday (hitting face). Lip swelling. Nasal swelling. EXAM: CT HEAD WITHOUT CONTRAST TECHNIQUE: Contiguous axial images were obtained from the base of the skull through the vertex without intravenous contrast. RADIATION DOSE REDUCTION: This exam was performed according to the departmental dose-optimization program which includes automated exposure control, adjustment of the mA and/or kV according to patient size and/or use of iterative reconstruction technique. COMPARISON:  None. FINDINGS: Brain: No age advanced or lobar predominant parenchymal atrophy. Patchy and  ill-defined hypoattenuation within the cerebral white matter, nonspecific but compatible with moderate for age chronic small vessel ischemic disease. Chronic lacunar infarct within the right thalamus. Small chronic infarct within the left cerebellar hemisphere. There is no acute intracranial hemorrhage. No demarcated cortical infarct. No extra-axial fluid collection. No evidence of an intracranial mass. No midline shift. Vascular: No hyperdense vessel.  Atherosclerotic calcifications. Skull: No calvarial fracture or aggressive osseous lesion. Sinuses/Orbits: No mass or acute finding within the imaged orbits. No significant paranasal sinus disease. IMPRESSION: 1. No evidence of an acute intracranial abnormality. 2. Moderate chronic small vessel ischemic changes within the cerebral white matter. 3. Chronic lacunar infarct within the right thalamus. 4. Small chronic infarct within the left cerebellar hemisphere. Electronically Signed   By: Jackey Loge D.O.   On: 05/19/2023 12:03    Procedures Procedures  {Document cardiac monitor, telemetry assessment procedure when appropriate:1}  Medications Ordered in ED Medications - No data to display  ED Course/ Medical Decision Making/ A&P   {   Click here for ABCD2, HEART and other calculatorsREFRESH Note before signing :1}                              Medical Decision Making Amount and/or Complexity of Data Reviewed Radiology: ordered.   ***  {Document critical care time when appropriate:1} {Document review of labs and clinical decision tools ie heart score, Chads2Vasc2 etc:1}  {Document your independent review of radiology images, and  any outside records:1} {Document your discussion with family members, caretakers, and with consultants:1} {Document social determinants of health affecting pt's care:1} {Document your decision making why or why not admission, treatments were needed:1} Final Clinical Impression(s) / ED Diagnoses Final diagnoses:   Concussion without loss of consciousness, initial encounter  Contusion of face, initial encounter    Rx / DC Orders ED Discharge Orders     None

## 2023-05-19 NOTE — ED Triage Notes (Signed)
Pt reports tripping over a speed bump yesterday while leaving [employer]. Pt reports hitting her face and both knees. Pt has swelling to her lips and nose. Denies taking blood thinner.

## 2023-05-19 NOTE — ED Notes (Signed)
Pt discharged home, not in distress, family present, discharge information discussed and verbalized understanding.

## 2023-05-19 NOTE — Discharge Instructions (Signed)
You are seen in the emergency room for fall.  CT scan reveals no evidence of brain bleed. As discussed, we suspect that you have a concussion.  Read the instructions provided on it.  Do everything possible to prevent falls. Additionally, it is possible that you might have minor facial fractures.  We recommend that you do not blow your nose.  Apply ice to the affected area.  Take Tylenol around-the-clock for pain.  If you start having changes with vision, you are having worsening pain over the face -return to the emergency room.

## 2023-05-22 NOTE — Patient Instructions (Addendum)
It was a pleasure to see you today.  Vaccines discussed.  Patient reports she received a COVID-19 booster at the pharmacy recently.  Patient will continue follow-up with ophthalmologist for glaucoma and macular degeneration.  Blood pressure is normal.  Lipid panel is normal with low-dose rosuvastatin.

## 2023-05-26 ENCOUNTER — Encounter: Payer: Self-pay | Admitting: Internal Medicine

## 2023-05-26 ENCOUNTER — Ambulatory Visit (INDEPENDENT_AMBULATORY_CARE_PROVIDER_SITE_OTHER): Payer: Medicare Other | Admitting: Internal Medicine

## 2023-05-26 VITALS — BP 120/80 | HR 85 | Ht 68.0 in | Wt 133.0 lb

## 2023-05-26 DIAGNOSIS — E7800 Pure hypercholesterolemia, unspecified: Secondary | ICD-10-CM | POA: Diagnosis not present

## 2023-05-26 DIAGNOSIS — I6381 Other cerebral infarction due to occlusion or stenosis of small artery: Secondary | ICD-10-CM

## 2023-05-26 DIAGNOSIS — W19XXXS Unspecified fall, sequela: Secondary | ICD-10-CM | POA: Diagnosis not present

## 2023-05-26 DIAGNOSIS — H903 Sensorineural hearing loss, bilateral: Secondary | ICD-10-CM | POA: Diagnosis not present

## 2023-05-26 DIAGNOSIS — H3530 Unspecified macular degeneration: Secondary | ICD-10-CM | POA: Diagnosis not present

## 2023-05-26 DIAGNOSIS — I10 Essential (primary) hypertension: Secondary | ICD-10-CM | POA: Diagnosis not present

## 2023-05-26 DIAGNOSIS — S060XAA Concussion with loss of consciousness status unknown, initial encounter: Secondary | ICD-10-CM | POA: Insufficient documentation

## 2023-05-26 DIAGNOSIS — S060X0D Concussion without loss of consciousness, subsequent encounter: Secondary | ICD-10-CM | POA: Diagnosis not present

## 2023-05-26 NOTE — Patient Instructions (Addendum)
We are glad you were not seriously injured in the fall at the shopping center but likely had a mild concussion. Symptoms have improved. We are glad to know you have a Life-Alert device. You have a Medicare Wellness visit scheduled for Sept 2025 but we can see you sooner if necessary. Please call if you have questions or concerns.

## 2023-05-26 NOTE — Progress Notes (Signed)
Patient Care Team: Margaree Mackintosh, MD as PCP - General (Internal Medicine) Janalyn Harder, MD (Inactive) as Consulting Physician (Dermatology)  Visit Date: 05/26/23  Subjective:    Patient ID: Veronica Mcgrath , Female   DOB: 07-06-1935, 88 y.o.    MRN: [id number]   88 y.o. Female presents today for  ED follow-up. Seen in [facility] ED on 05/19/23 for a fall outside [employer] here in [place] where she tripped on a speed bump in the parking lot just outside the store and hit her knees first and then her head. Had no LOC. She declined emergency treatment and drove herself home. The day after her fall, she woke up with dizziness and unsteadiness, prompting her to go to the ED. She did not lose consciousness during the fall. Described the dizziness as lightheadedness and not  a spinning sensation. CT head showed: 1) No evidence of an acute intracranial abnormality, 2) Moderate chronic small vessel ischemic changes within the cerebral white matter, 3) Chronic lacunar infarct within the right thalamus, 4) Small chronic infarct within the left cerebellar hemisphere. Does not take blood thinners. Denies headache, vision changes. Her appetite is normal. She now has an emergency alert device to wear around her neck. She is going to be getting a cane. She still has some swelling on her chin and face but is feeling better. Denies memory loss or blacking out.  Past Medical History:  Diagnosis Date   Glaucoma    Hyperlipidemia    Hypertension      Family History  Problem Relation Age of Onset   Diabetes Mother    Stroke Mother    Cancer Father    Thyroid disease Neg Hx    Breast cancer Neg Hx     Social Hx: Retired. Has 4 year college degree. Social alcohol consumption but not daily. Had just left my office prior to visiting [employer] that morning. Resides alone. Son, Viviann Spare, lives in the area.     Review of Systems  Constitutional:  Negative for fever and malaise/fatigue.  HENT:   Negative for congestion.   Eyes:  Negative for blurred vision and double vision.  Respiratory:  Negative for cough and shortness of breath.   Cardiovascular:  Negative for chest pain, palpitations and leg swelling.  Gastrointestinal:  Negative for vomiting.  Musculoskeletal:  Negative for back pain.  Skin:  Negative for rash.       (+) Swelling face (+) Scattered contusions  Neurological:  Negative for loss of consciousness and headaches.        Objective:   Vitals: BP 120/80   Pulse 85   Ht 5\' 8"  (1.727 m)   Wt 133 lb (60.3 kg)   SpO2 97%   BMI 20.22 kg/m    Physical Exam Vitals and nursing note reviewed.  Constitutional:      General: She is not in acute distress.    Appearance: Normal appearance. She is not toxic-appearing.  HENT:     Head: Normocephalic and atraumatic.  Cardiovascular:     Rate and Rhythm: Normal rate and regular rhythm. No extrasystoles are present.    Pulses: Normal pulses.     Heart sounds: Normal heart sounds. No murmur heard.    No friction rub. No gallop.  Pulmonary:     Effort: Pulmonary effort is normal. No respiratory distress.     Breath sounds: Normal breath sounds. No wheezing or rales.  Skin:    General: Skin is warm  and dry.     Comments: Contusion left upper arm, left hip area, right knee. Contusion under chin. Resolving contusion lower left thigh. Resolving contusion zygomatic arch.  Neurological:     Mental Status: She is alert and oriented to person, place, and time. Mental status is at baseline.     Cranial Nerves: Cranial nerves 2-12 are intact.     Sensory: Sensation is intact.     Motor: Motor function is intact.     Coordination: Coordination is intact.     Gait: Gait is intact.  Psychiatric:        Mood and Affect: Mood normal.        Behavior: Behavior normal.        Thought Content: Thought content normal.        Judgment: Judgment normal.       Results:   Studies obtained and personally reviewed by  me:  CT head showed: 1) No evidence of an acute intracranial abnormality, 2) Moderate chronic small vessel ischemic changes within the cerebral white matter, 3) Chronic lacunar infarct within the right thalamus, 4) Small chronic infarct within the left cerebellar hemisphere.  Labs:       Component Value Date/Time   NA 137 05/16/2023 0916   K 4.4 05/16/2023 0916   CL 99 05/16/2023 0916   CO2 28 05/16/2023 0916   GLUCOSE 91 05/16/2023 0916   BUN 16 05/16/2023 0916   CREATININE 0.76 05/16/2023 0916   CALCIUM 9.6 05/16/2023 0916   PROT 6.7 05/16/2023 0916   ALBUMIN 4.2 04/14/2017 0929   AST 13 05/16/2023 0916   ALT 13 05/16/2023 0916   ALKPHOS 45 04/14/2017 0929   BILITOT 0.6 05/16/2023 0916   GFRNONAA 63 04/24/2020 1128   GFRAA 73 04/24/2020 1128     Lab Results  Component Value Date   WBC 5.4 05/16/2023   HGB 15.2 05/16/2023   HCT 45.8 (H) 05/16/2023   MCV 91.6 05/16/2023   PLT 280 05/16/2023    Lab Results  Component Value Date   CHOL 179 05/16/2023   HDL 88 05/16/2023   LDLCALC 78 05/16/2023   TRIG 54 05/16/2023   CHOLHDL 2.0 05/16/2023    No results found for: "HGBA1C"   Lab Results  Component Value Date   TSH 3.02 05/16/2023      Assessment & Plan:  Mild concussion- symptoms have improved due to a Fall striking her head on pavement associated with with multiple contusions; hospital/ED follow-up: she is feeling much better and contusions are healing well. Ordered CMET, CBC with Diff/Plat. Will contact with results. No residual dizziness at this time  Hx of glaucoma seen by Dr. Charlotte Sanes  Hx of HTN treated with 2 drug regimen  Hx hyperlipidemia treated with low dose statin  Lacunar infarcts seen on Brain CT in ED- asymptomatic not sure when these occurred. One in left cerebellar hemisphere and one in right thalamus  I,Alexander Ruley,acting as a scribe for Margaree Mackintosh, MD.,have documented all relevant documentation on the behalf of Margaree Mackintosh, MD,as  directed by  Margaree Mackintosh, MD while in the presence of Margaree Mackintosh, MD.   I, Margaree Mackintosh, MD, have reviewed all documentation for this visit. The documentation on 05/26/23 for the exam, diagnosis, procedures, and orders are all accurate and complete.

## 2023-05-27 LAB — COMPLETE METABOLIC PANEL WITH GFR
AG Ratio: 1.6 (calc) (ref 1.0–2.5)
ALT: 11 U/L (ref 6–29)
AST: 12 U/L (ref 10–35)
Albumin: 4 g/dL (ref 3.6–5.1)
Alkaline phosphatase (APISO): 53 U/L (ref 37–153)
BUN: 12 mg/dL (ref 7–25)
CO2: 26 mmol/L (ref 20–32)
Calcium: 9 mg/dL (ref 8.6–10.4)
Chloride: 96 mmol/L — ABNORMAL LOW (ref 98–110)
Creat: 0.8 mg/dL (ref 0.60–0.95)
Globulin: 2.5 g/dL (ref 1.9–3.7)
Glucose, Bld: 86 mg/dL (ref 65–99)
Potassium: 4.1 mmol/L (ref 3.5–5.3)
Sodium: 135 mmol/L (ref 135–146)
Total Bilirubin: 0.6 mg/dL (ref 0.2–1.2)
Total Protein: 6.5 g/dL (ref 6.1–8.1)
eGFR: 72 mL/min/{1.73_m2} (ref >=60)

## 2023-05-27 LAB — CBC WITH DIFFERENTIAL/PLATELET
Absolute Monocytes: 590 {cells}/uL (ref 200–950)
Basophils Absolute: 47 {cells}/uL (ref 0–200)
Basophils Relative: 0.7 %
Eosinophils Absolute: 67 {cells}/uL (ref 15–500)
Eosinophils Relative: 1 %
HCT: 43.6 % (ref 35.0–45.0)
Hemoglobin: 14.5 g/dL (ref 11.7–15.5)
Lymphs Abs: 1668 {cells}/uL (ref 850–3900)
MCH: 30.8 pg (ref 27.0–33.0)
MCHC: 33.3 g/dL (ref 32.0–36.0)
MCV: 92.6 fL (ref 80.0–100.0)
MPV: 9.6 fL (ref 7.5–12.5)
Monocytes Relative: 8.8 %
Neutro Abs: 4328 {cells}/uL (ref 1500–7800)
Neutrophils Relative %: 64.6 %
Platelets: 305 10*3/uL (ref 140–400)
RBC: 4.71 10*6/uL (ref 3.80–5.10)
RDW: 12.1 % (ref 11.0–15.0)
Total Lymphocyte: 24.9 %
WBC: 6.7 10*3/uL (ref 3.8–10.8)

## 2023-07-10 ENCOUNTER — Other Ambulatory Visit: Payer: Self-pay

## 2023-07-10 MED ORDER — BENAZEPRIL HCL 20 MG PO TABS: 20.0000 mg | ORAL_TABLET | Freq: Every day | ORAL | 0 refills | Status: DC

## 2023-07-17 ENCOUNTER — Other Ambulatory Visit: Payer: Self-pay

## 2023-07-17 MED ORDER — BENAZEPRIL HCL 20 MG PO TABS: 20.0000 mg | ORAL_TABLET | Freq: Every day | ORAL | 0 refills | Status: DC

## 2023-08-22 DIAGNOSIS — H401111 Primary open-angle glaucoma, right eye, mild stage: Secondary | ICD-10-CM | POA: Diagnosis not present

## 2023-08-22 DIAGNOSIS — H524 Presbyopia: Secondary | ICD-10-CM | POA: Diagnosis not present

## 2023-08-22 DIAGNOSIS — H353132 Nonexudative age-related macular degeneration, bilateral, intermediate dry stage: Secondary | ICD-10-CM | POA: Diagnosis not present

## 2023-08-22 DIAGNOSIS — Z961 Presence of intraocular lens: Secondary | ICD-10-CM | POA: Diagnosis not present

## 2023-08-22 DIAGNOSIS — D3131 Benign neoplasm of right choroid: Secondary | ICD-10-CM | POA: Diagnosis not present

## 2023-08-22 DIAGNOSIS — H401122 Primary open-angle glaucoma, left eye, moderate stage: Secondary | ICD-10-CM | POA: Diagnosis not present

## 2023-09-28 ENCOUNTER — Other Ambulatory Visit: Payer: Self-pay

## 2023-09-28 MED ORDER — HYDROCHLOROTHIAZIDE 25 MG PO TABS: 25.0000 mg | ORAL_TABLET | Freq: Every day | ORAL | 2 refills | Status: DC

## 2023-09-29 DIAGNOSIS — H43393 Other vitreous opacities, bilateral: Secondary | ICD-10-CM | POA: Diagnosis not present

## 2023-09-29 DIAGNOSIS — H35373 Puckering of macula, bilateral: Secondary | ICD-10-CM | POA: Diagnosis not present

## 2023-09-29 DIAGNOSIS — H353132 Nonexudative age-related macular degeneration, bilateral, intermediate dry stage: Secondary | ICD-10-CM | POA: Diagnosis not present

## 2023-09-29 DIAGNOSIS — D3131 Benign neoplasm of right choroid: Secondary | ICD-10-CM | POA: Diagnosis not present

## 2023-09-29 DIAGNOSIS — H401132 Primary open-angle glaucoma, bilateral, moderate stage: Secondary | ICD-10-CM | POA: Diagnosis not present

## 2023-09-29 DIAGNOSIS — H43813 Vitreous degeneration, bilateral: Secondary | ICD-10-CM | POA: Diagnosis not present

## 2024-01-18 ENCOUNTER — Other Ambulatory Visit: Payer: Self-pay

## 2024-01-18 MED ORDER — BENAZEPRIL HCL 20 MG PO TABS: 20.0000 mg | ORAL_TABLET | Freq: Every day | ORAL | 1 refills | Status: DC

## 2024-02-20 DIAGNOSIS — H401111 Primary open-angle glaucoma, right eye, mild stage: Secondary | ICD-10-CM | POA: Diagnosis not present

## 2024-02-20 DIAGNOSIS — H1789 Other corneal scars and opacities: Secondary | ICD-10-CM | POA: Diagnosis not present

## 2024-02-20 DIAGNOSIS — Z961 Presence of intraocular lens: Secondary | ICD-10-CM | POA: Diagnosis not present

## 2024-02-20 DIAGNOSIS — H401122 Primary open-angle glaucoma, left eye, moderate stage: Secondary | ICD-10-CM | POA: Diagnosis not present

## 2024-03-29 DIAGNOSIS — H43393 Other vitreous opacities, bilateral: Secondary | ICD-10-CM | POA: Diagnosis not present

## 2024-03-29 DIAGNOSIS — H401132 Primary open-angle glaucoma, bilateral, moderate stage: Secondary | ICD-10-CM | POA: Diagnosis not present

## 2024-03-29 DIAGNOSIS — H353132 Nonexudative age-related macular degeneration, bilateral, intermediate dry stage: Secondary | ICD-10-CM | POA: Diagnosis not present

## 2024-03-29 DIAGNOSIS — H35373 Puckering of macula, bilateral: Secondary | ICD-10-CM | POA: Diagnosis not present

## 2024-03-29 DIAGNOSIS — D3131 Benign neoplasm of right choroid: Secondary | ICD-10-CM | POA: Diagnosis not present

## 2024-03-29 DIAGNOSIS — H43813 Vitreous degeneration, bilateral: Secondary | ICD-10-CM | POA: Diagnosis not present

## 2024-05-20 ENCOUNTER — Other Ambulatory Visit: Payer: Medicare Other

## 2024-05-20 DIAGNOSIS — Z1329 Encounter for screening for other suspected endocrine disorder: Secondary | ICD-10-CM

## 2024-05-20 DIAGNOSIS — Z0000 Encounter for general adult medical examination without abnormal findings: Secondary | ICD-10-CM

## 2024-05-20 DIAGNOSIS — E7800 Pure hypercholesterolemia, unspecified: Secondary | ICD-10-CM

## 2024-05-20 DIAGNOSIS — I10 Essential (primary) hypertension: Secondary | ICD-10-CM

## 2024-05-20 LAB — COMPLETE METABOLIC PANEL WITHOUT GFR
AG Ratio: 1.8 (calc) (ref 1.0–2.5)
ALT: 12 U/L (ref 6–29)
AST: 14 U/L (ref 10–35)
Albumin: 4.5 g/dL (ref 3.6–5.1)
Alkaline phosphatase (APISO): 53 U/L (ref 37–153)
BUN: 14 mg/dL (ref 7–25)
CO2: 30 mmol/L (ref 20–32)
Calcium: 10 mg/dL (ref 8.6–10.4)
Chloride: 102 mmol/L (ref 98–110)
Creat: 0.7 mg/dL (ref 0.60–0.95)
Globulin: 2.5 g/dL (ref 1.9–3.7)
Glucose, Bld: 84 mg/dL (ref 65–99)
Potassium: 5.2 mmol/L (ref 3.5–5.3)
Sodium: 139 mmol/L (ref 135–146)
Total Bilirubin: 0.8 mg/dL (ref 0.2–1.2)
Total Protein: 7 g/dL (ref 6.1–8.1)

## 2024-05-20 LAB — CBC WITH DIFFERENTIAL/PLATELET
Absolute Lymphocytes: 1369 {cells}/uL (ref 850–3900)
Absolute Monocytes: 631 {cells}/uL (ref 200–950)
Basophils Absolute: 57 {cells}/uL (ref 0–200)
Basophils Relative: 0.7 %
Eosinophils Absolute: 74 {cells}/uL (ref 15–500)
Eosinophils Relative: 0.9 %
HCT: 50.8 % — ABNORMAL HIGH (ref 35.0–45.0)
Hemoglobin: 16.5 g/dL — ABNORMAL HIGH (ref 11.7–15.5)
MCH: 30.9 pg (ref 27.0–33.0)
MCHC: 32.5 g/dL (ref 32.0–36.0)
MCV: 95.1 fL (ref 80.0–100.0)
MPV: 10.2 fL (ref 7.5–12.5)
Monocytes Relative: 7.7 %
Neutro Abs: 6068 {cells}/uL (ref 1500–7800)
Neutrophils Relative %: 74 %
Platelets: 273 Thousand/uL (ref 140–400)
RBC: 5.34 Million/uL — ABNORMAL HIGH (ref 3.80–5.10)
RDW: 12.5 % (ref 11.0–15.0)
Total Lymphocyte: 16.7 %
WBC: 8.2 Thousand/uL (ref 3.8–10.8)

## 2024-05-20 LAB — LIPID PANEL
Cholesterol: 219 mg/dL — ABNORMAL HIGH (ref <200)
HDL: 92 mg/dL (ref >=50)
LDL Cholesterol (Calc): 111 mg/dL — ABNORMAL HIGH
Non-HDL Cholesterol (Calc): 127 mg/dL (ref <130)
Total CHOL/HDL Ratio: 2.4 (calc) (ref <5.0)
Triglycerides: 73 mg/dL (ref <150)

## 2024-05-20 LAB — TSH: TSH: 2.45 m[IU]/L (ref 0.40–4.50)

## 2024-05-21 ENCOUNTER — Encounter: Payer: Self-pay | Admitting: Internal Medicine

## 2024-05-21 ENCOUNTER — Ambulatory Visit (INDEPENDENT_AMBULATORY_CARE_PROVIDER_SITE_OTHER): Payer: Medicare Other | Admitting: Internal Medicine

## 2024-05-21 VITALS — BP 120/80 | HR 72 | Ht 68.0 in | Wt 132.0 lb

## 2024-05-21 DIAGNOSIS — Z0000 Encounter for general adult medical examination without abnormal findings: Secondary | ICD-10-CM | POA: Diagnosis not present

## 2024-05-21 DIAGNOSIS — Z23 Encounter for immunization: Secondary | ICD-10-CM | POA: Diagnosis not present

## 2024-05-21 DIAGNOSIS — E7800 Pure hypercholesterolemia, unspecified: Secondary | ICD-10-CM

## 2024-05-21 DIAGNOSIS — I6381 Other cerebral infarction due to occlusion or stenosis of small artery: Secondary | ICD-10-CM | POA: Diagnosis not present

## 2024-05-21 DIAGNOSIS — Z800 Family history of malignant neoplasm of digestive organs: Secondary | ICD-10-CM | POA: Diagnosis not present

## 2024-05-21 DIAGNOSIS — H10413 Chronic giant papillary conjunctivitis, bilateral: Secondary | ICD-10-CM

## 2024-05-21 DIAGNOSIS — K644 Residual hemorrhoidal skin tags: Secondary | ICD-10-CM | POA: Diagnosis not present

## 2024-05-21 DIAGNOSIS — H3530 Unspecified macular degeneration: Secondary | ICD-10-CM | POA: Diagnosis not present

## 2024-05-21 DIAGNOSIS — Z8719 Personal history of other diseases of the digestive system: Secondary | ICD-10-CM

## 2024-05-21 DIAGNOSIS — I10 Essential (primary) hypertension: Secondary | ICD-10-CM | POA: Diagnosis not present

## 2024-05-21 DIAGNOSIS — H903 Sensorineural hearing loss, bilateral: Secondary | ICD-10-CM

## 2024-05-21 MED ORDER — COVID-19 MRNA VACC (MODERNA) 50 MCG/0.5ML IM SUSP: 0.5000 mL | Freq: Once | INTRAMUSCULAR | 0 refills | Status: AC

## 2024-05-21 NOTE — Patient Instructions (Signed)
 Next appointment: Follow up in one year for your annual wellness visit    Preventive Care 65 Years and Older, Female Preventive care refers to lifestyle choices and visits with your health care provider that can promote health and wellness. What does preventive care include? A yearly physical exam. This is also called an annual well check. Dental exams once or twice a year. Routine eye exams. Ask your health care provider how often you should have your eyes checked. Personal lifestyle choices, including: Daily care of your teeth and gums. Regular physical activity. Eating a healthy diet. Avoiding tobacco and drug use. Limiting alcohol use. Practicing safe sex. Taking low-dose aspirin every day. Taking vitamin and mineral supplements as recommended by your health care provider. What happens during an annual well check? The services and screenings done by your health care provider during your annual well check will depend on your age, overall health, lifestyle risk factors, and family history of disease. Counseling  Your health care provider may ask you questions about your: Alcohol use. Tobacco use. Drug use. Emotional well-being. Home and relationship well-being. Sexual activity. Eating habits. History of falls. Memory and ability to understand (cognition). Work and work Astronomer. Reproductive health. Screening  You may have the following tests or measurements: Height, weight, and BMI. Blood pressure. Lipid and cholesterol levels. These may be checked every 5 years, or more frequently if you are over 17 years old. Skin check. Lung cancer screening. You may have this screening every year starting at age 19 if you have a 30-pack-year history of smoking and currently smoke or have quit within the past 15 years. Fecal occult blood test (FOBT) of the stool. You may have this test every year starting at age 31. Flexible sigmoidoscopy or colonoscopy. You may have a  sigmoidoscopy every 5 years or a colonoscopy every 10 years starting at age 58. Hepatitis C blood test. Hepatitis B blood test. Sexually transmitted disease (STD) testing. Diabetes screening. This is done by checking your blood sugar (glucose) after you have not eaten for a while (fasting). You may have this done every 1-3 years. Bone density scan. This is done to screen for osteoporosis. You may have this done starting at age 87. Mammogram. This may be done every 1-2 years. Talk to your health care provider about how often you should have regular mammograms. Talk with your health care provider about your test results, treatment options, and if necessary, the need for more tests. Vaccines  Your health care provider may recommend certain vaccines, such as: Influenza vaccine. This is recommended every year. Tetanus, diphtheria, and acellular pertussis (Tdap, Td) vaccine. You may need a Td booster every 10 years. Zoster vaccine. You may need this after age 52. Pneumococcal 13-valent conjugate (PCV13) vaccine. One dose is recommended after age 18. Pneumococcal polysaccharide (PPSV23) vaccine. One dose is recommended after age 25. Talk to your health care provider about which screenings and vaccines you need and how often you need them. This information is not intended to replace advice given to you by your health care provider. Make sure you discuss any questions you have with your health care provider. Document Released: 09/25/2015 Document Revised: 05/18/2016 Document Reviewed: 06/30/2015 Elsevier Interactive Patient Education  2017 [employer].  Fall Prevention in the Home Falls can cause injuries. They can happen to people of all ages. There are many things you can do to make your home safe and to help prevent falls. What can I do on the outside of  my home? Regularly fix the edges of walkways and driveways and fix any cracks. Remove anything that might make you trip as you walk through a  door, such as a raised step or threshold. Trim any bushes or trees on the path to your home. Use bright outdoor lighting. Clear any walking paths of anything that might make someone trip, such as rocks or tools. Regularly check to see if handrails are loose or broken. Make sure that both sides of any steps have handrails. Any raised decks and porches should have guardrails on the edges. Have any leaves, snow, or ice cleared regularly. Use sand or salt on walking paths during winter. Clean up any spills in your garage right away. This includes oil or grease spills. What can I do in the bathroom? Use night lights. Install grab bars by the toilet and in the tub and shower. Do not use towel bars as grab bars. Use non-skid mats or decals in the tub or shower. If you need to sit down in the shower, use a plastic, non-slip stool. Keep the floor dry. Clean up any water that spills on the floor as soon as it happens. Remove soap buildup in the tub or shower regularly. Attach bath mats securely with double-sided non-slip rug tape. Do not have throw rugs and other things on the floor that can make you trip. What can I do in the bedroom? Use night lights. Make sure that you have a light by your bed that is easy to reach. Do not use any sheets or blankets that are too big for your bed. They should not hang down onto the floor. Have a firm chair that has side arms. You can use this for support while you get dressed. Do not have throw rugs and other things on the floor that can make you trip. What can I do in the kitchen? Clean up any spills right away. Avoid walking on wet floors. Keep items that you use a lot in easy-to-reach places. If you need to reach something above you, use a strong step stool that has a grab bar. Keep electrical cords out of the way. Do not use floor polish or wax that makes floors slippery. If you must use wax, use non-skid floor wax. Do not have throw rugs and other things  on the floor that can make you trip. What can I do with my stairs? Do not leave any items on the stairs. Make sure that there are handrails on both sides of the stairs and use them. Fix handrails that are broken or loose. Make sure that handrails are as long as the stairways. Check any carpeting to make sure that it is firmly attached to the stairs. Fix any carpet that is loose or worn. Avoid having throw rugs at the top or bottom of the stairs. If you do have throw rugs, attach them to the floor with carpet tape. Make sure that you have a light switch at the top of the stairs and the bottom of the stairs. If you do not have them, ask someone to add them for you. What else can I do to help prevent falls? Wear shoes that: Do not have high heels. Have rubber bottoms. Are comfortable and fit you well. Are closed at the toe. Do not wear sandals. If you use a stepladder: Make sure that it is fully opened. Do not climb a closed stepladder. Make sure that both sides of the stepladder are locked into place. Ask  someone to hold it for you, if possible. Clearly mark and make sure that you can see: Any grab bars or handrails. First and last steps. Where the edge of each step is. Use tools that help you move around (mobility aids) if they are needed. These include: Canes. Walkers. Scooters. Crutches. Turn on the lights when you go into a dark area. Replace any light bulbs as soon as they burn out. Set up your furniture so you have a clear path. Avoid moving your furniture around. If any of your floors are uneven, fix them. If there are any pets around you, be aware of where they are. Review your medicines with your doctor. Some medicines can make you feel dizzy. This can increase your chance of falling. Ask your doctor what other things that you can do to help prevent falls. This information is not intended to replace advice given to you by your health care provider. Make sure you discuss any  questions you have with your health care provider. Document Released: 06/25/2009 Document Revised: 02/04/2016 Document Reviewed: 10/03/2014 Elsevier Interactive Patient Education  2017 [employer].

## 2024-05-21 NOTE — Progress Notes (Deleted)
 Subjective:    Veronica Mcgrath is a 89 y.o. female who presents for a Welcome to Medicare exam.          Objective:    Today's Vitals   05/21/24 0957  BP: 120/80  Pulse: 72  SpO2: 98%  Weight: 132 lb (59.9 kg)  Height: 5' 8 (1.727 m)  PainSc: 0-No pain  Body mass index is 20.07 kg/m.  Medications Outpatient Encounter Medications as of 05/21/2024  Medication Sig   COVID-19 mRNA vaccine, Moderna, >/= 58yrs, (SPIKEVAX) injection Inject 0.5 mLs into the muscle once for 1 dose.   benazepril  (LOTENSIN ) 20 MG tablet Take 1 tablet (20 mg total) by mouth daily.   [DISCONTINUED] hydrochlorothiazide  (HYDRODIURIL ) 25 MG tablet Take 1 tablet (25 mg total) by mouth daily.   [DISCONTINUED] rosuvastatin  (CRESTOR ) 5 MG tablet Take 1 tablet (5 mg total) by mouth daily.   No facility-administered encounter medications on file as of 05/21/2024.     History: Past Medical History:  Diagnosis Date   Glaucoma    Hyperlipidemia    Hypertension    Past Surgical History:  Procedure Laterality Date   LUMBAR DISC SURGERY  2010    Family History  Problem Relation Age of Onset   Diabetes Mother    Stroke Mother    Cancer Father    Thyroid  disease Neg Hx    Breast cancer Neg Hx    Social History   Occupational History   Not on file  Tobacco Use   Smoking status: Never   Smokeless tobacco: Never  Substance and Sexual Activity   Alcohol use: Not on file   Drug use: Not on file   Sexual activity: Not on file    Tobacco Counseling Counseling given: No   Immunizations and Health Maintenance Immunization History  Administered Date(s) Administered   DT (Pediatric) 09/12/2001   INFLUENZA, HIGH DOSE SEASONAL PF 05/21/2024   Influenza Split 07/28/2011, 07/27/2012   Influenza, Seasonal, Injecte, Preservative Fre 05/18/2023   Influenza,inj,Quad PF,6+ Mos 06/19/2013, 06/18/2014, 05/26/2015, 07/15/2016, 07/14/2017, 06/28/2018, 05/16/2019, 05/27/2020, 07/07/2021   Influenza-Unspecified  05/29/2022   PFIZER(Purple Top)SARS-COV-2 Vaccination 10/02/2019, 10/23/2019, 07/11/2020   PNEUMOCOCCAL CONJUGATE-20 05/12/2022   Pfizer(Comirnaty)Fall Seasonal Vaccine 12 years and older 06/16/2022   Pneumococcal Conjugate-13 07/01/2015   Pneumococcal Polysaccharide-23 06/20/2017   Tdap 01/24/2012   Health Maintenance Due  Topic Date Due   DTaP/Tdap/Td (3 - Td or Tdap) 01/23/2022   MAMMOGRAM  06/09/2023    Activities of Daily Living    05/21/2024   10:04 AM  In your present state of health, do you have any difficulty performing the following activities:  Hearing? 1  Vision? 0  Difficulty concentrating or making decisions? 0  Walking or climbing stairs? 0  Dressing or bathing? 0  Doing errands, shopping? 0  Preparing Food and eating ? N  Using the Toilet? N  In the past six months, have you accidently leaked urine? N  Do you have problems with loss of bowel control? N  Managing your Medications? N  Managing your Finances? N  Housekeeping or managing your Housekeeping? N    Physical Exam   Physical Exam (optional), or other factors deemed appropriate based on the beneficiary's medical and social history and current clinical standards.   Advanced Directives: Does Patient Have a Medical Advance Directive?: Yes Type of Advance Directive: Living will, Healthcare Power of Attorney Copy of Healthcare Power of Attorney in Chart?: No - copy requested  EKG:  {ekg findings:315101}  Assessment:    This is a routine wellness examination for this patient . ***  Vision/Hearing screen No results found.   Goals   None     Depression Screen    05/21/2024   10:23 AM 05/18/2023   10:03 AM 05/18/2023    9:57 AM 05/12/2022   10:04 AM  PHQ 2/9 Scores  PHQ - 2 Score 0 0 0 0     Fall Risk    05/18/2023   10:03 AM  Fall Risk   Falls in the past year? 0  Number falls in past yr: 0  Injury with Fall? 0  Risk for fall due to : No Fall Risks  Follow up Falls evaluation  completed    Cognitive Function:        05/21/2024   10:05 AM 05/12/2022   10:08 AM  6CIT Screen  What Year? 0 points 0 points  What month? 0 points 0 points  What time? 0 points 0 points  Count back from 20 0 points 0 points  Months in reverse 0 points 0 points  Repeat phrase 0 points 0 points  Total Score 0 points 0 points    Patient Care Team: Perri Ronal PARAS, MD as PCP - General (Internal Medicine) Livingston Rigg, MD as Consulting Physician (Dermatology)     Plan:   ***  I have personally reviewed and noted the following in the patient's chart:   Medical and social history Use of alcohol, tobacco or illicit drugs  Current medications and supplements including opioid prescriptions. {Opioid Prescriptions:[phone]} Functional ability and status Nutritional status Physical activity Advanced directives List of other physicians Hospitalizations, surgeries, and ER visits in previous 12 months Vitals Screenings to include cognitive, depression, and falls Referrals and appointments  In addition, I have reviewed and discussed with patient certain preventive protocols, quality metrics, and best practice recommendations. A written personalized care plan for preventive services as well as general preventive health recommendations were provided to patient.     Dequincy Born Yarnell, CMA 05/21/2024

## 2024-05-21 NOTE — Progress Notes (Unsigned)
 Subjective:   Veronica Mcgrath is a 89 y.o. female who presents for Medicare Annual (Subsequent) preventive examination.  Visit Complete: In person  Patient Medicare AWV questionnaire was completed by the patient on 05/21/2024; I have confirmed that all information answered by patient is correct and no changes since this date.        Objective:    Today's Vitals   05/21/24 0957  BP: 120/80  Pulse: 72  SpO2: 98%  Weight: 132 lb (59.9 kg)  Height: 5' 8 (1.727 m)  PainSc: 0-No pain   Body mass index is 20.07 kg/m.     05/21/2024   10:06 AM 05/19/2023    9:41 AM 05/12/2022   10:03 AM  Advanced Directives  Does Patient Have a Medical Advance Directive? Yes Yes Yes  Type of Advance Directive Living will;Healthcare Power of [employer] Power of [place];Living will Healthcare Power of [place];Living will  Does patient want to make changes to medical advance directive?   No - Patient declined  Copy of Healthcare Power of Attorney in Chart? No - copy requested  No - copy requested    Current Medications (verified) Outpatient Encounter Medications as of 05/21/2024  Medication Sig   COVID-19 mRNA vaccine, Moderna, >/= 27yrs, (SPIKEVAX) injection Inject 0.5 mLs into the muscle once for 1 dose.   benazepril  (LOTENSIN ) 20 MG tablet Take 1 tablet (20 mg total) by mouth daily.   [DISCONTINUED] hydrochlorothiazide  (HYDRODIURIL ) 25 MG tablet Take 1 tablet (25 mg total) by mouth daily.   [DISCONTINUED] rosuvastatin  (CRESTOR ) 5 MG tablet Take 1 tablet (5 mg total) by mouth daily.   No facility-administered encounter medications on file as of 05/21/2024.    Allergies (verified) Patient has no known allergies.   History: Past Medical History:  Diagnosis Date   Glaucoma    Hyperlipidemia    Hypertension    Past Surgical History:  Procedure Laterality Date   LUMBAR DISC SURGERY  2010   Family History  Problem Relation Age of Onset   Diabetes Mother    Stroke Mother     Cancer Father    Thyroid  disease Neg Hx    Breast cancer Neg Hx    Social History   Socioeconomic History   Marital status: Widowed    Spouse name: Not on file   Number of children: Not on file   Years of education: Not on file   Highest education level: Not on file  Occupational History   Not on file  Tobacco Use   Smoking status: Never   Smokeless tobacco: Never  Substance and Sexual Activity   Alcohol use: Not on file   Drug use: Not on file   Sexual activity: Not on file  Other Topics Concern   Not on file  Social History Narrative   Not on file   Social Drivers of Health   Financial Resource Strain: Low Risk  (05/21/2024)   Overall Financial Resource Strain (CARDIA)    Difficulty of Paying Living Expenses: Not very hard  Food Insecurity: No Food Insecurity (05/21/2024)   Hunger Vital Sign    Worried About Running Out of Food in the Last Year: Never true    Ran Out of Food in the Last Year: Never true  Transportation Needs: No Transportation Needs (05/21/2024)   PRAPARE - Administrator, Civil Service (Medical): No    Lack of Transportation (Non-Medical): No  Physical Activity: Insufficiently Active (05/21/2024)   Exercise Vital Sign  Days of Exercise per Week: 3 days    Minutes of Exercise per Session: 10 min  Stress: No Stress Concern Present (05/21/2024)   [employer] of Occupational Health - Occupational Stress Questionnaire    Feeling of Stress: Not at all  Social Connections: Moderately Isolated (05/21/2024)   Social Connection and Isolation Panel    Frequency of Communication with Friends and Family: Twice a week    Frequency of Social Gatherings with Friends and Family: Twice a week    Attends Religious Services: 1 to 4 times per year    Active Member of [employer] or Organizations: No    Attends Banker Meetings: Never    Marital Status: Widowed    Tobacco Counseling Counseling given: No   Clinical Intake:     Pain Score:  0-No pain                  Activities of Daily Living    05/21/2024   10:04 AM  In your present state of health, do you have any difficulty performing the following activities:  Hearing? 1  Vision? 0  Difficulty concentrating or making decisions? 0  Walking or climbing stairs? 0  Dressing or bathing? 0  Doing errands, shopping? 0  Preparing Food and eating ? N  Using the Toilet? N  In the past six months, have you accidently leaked urine? N  Do you have problems with loss of bowel control? N  Managing your Medications? N  Managing your Finances? N  Housekeeping or managing your Housekeeping? N    Patient Care Team: Perri Ronal PARAS, MD as PCP - General (Internal Medicine) Livingston Rigg, MD as Consulting Physician (Dermatology)  Indicate any recent Medical Services you may have received from other than [facility] providers in the past year (date may be approximate).     Assessment:   This is a routine wellness examination for Veronica Mcgrath.  Hearing/Vision screen No results found.   Goals Addressed   None    Depression Screen    05/21/2024   10:23 AM 05/18/2023   10:03 AM 05/18/2023    9:57 AM 05/12/2022   10:04 AM 04/29/2021   10:12 AM 04/27/2020   10:02 AM 04/25/2019   10:01 AM  PHQ 2/9 Scores  PHQ - 2 Score 0 0 0 0 0 0 0    Fall Risk    05/18/2023   10:03 AM 05/18/2023    9:56 AM 05/12/2022   10:04 AM 04/29/2021   10:13 AM 04/27/2020   10:02 AM  Fall Risk   Falls in the past year? 0 0 0 0 0  Number falls in past yr: 0 0 0 0 0  Injury with Fall? 0 0 0 0   Risk for fall due to : No Fall Risks No Fall Risks No Fall Risks No Fall Risks   Follow up Falls evaluation completed Falls evaluation completed Falls evaluation completed  Falls evaluation completed  Falls evaluation completed      Data saved with a previous flowsheet row definition    MEDICARE RISK AT HOME: Medicare Risk at Home Any stairs in or around the home?: Yes If so, are there any without handrails?:  No Home free of loose throw rugs in walkways, pet beds, electrical cords, etc?: No Adequate lighting in your home to reduce risk of falls?: No Life alert?: No Use of a cane, walker or w/c?: No Grab bars in the bathroom?: Yes Shower chair or bench in shower?:  No Elevated toilet seat or a handicapped toilet?: No  TIMED UP AND GO:  Was the test performed?  No    Cognitive Function:        05/21/2024   10:05 AM 05/12/2022   10:08 AM  6CIT Screen  What Year? 0 points 0 points  What month? 0 points 0 points  What time? 0 points 0 points  Count back from 20 0 points 0 points  Months in reverse 0 points 0 points  Repeat phrase 0 points 0 points  Total Score 0 points 0 points    Immunizations Immunization History  Administered Date(s) Administered   DT (Pediatric) 09/12/2001   INFLUENZA, HIGH DOSE SEASONAL PF 05/21/2024   Influenza Split 07/28/2011, 07/27/2012   Influenza, Seasonal, Injecte, Preservative Fre 05/18/2023   Influenza,inj,Quad PF,6+ Mos 06/19/2013, 06/18/2014, 05/26/2015, 07/15/2016, 07/14/2017, 06/28/2018, 05/16/2019, 05/27/2020, 07/07/2021   Influenza-Unspecified 05/29/2022   PFIZER(Purple Top)SARS-COV-2 Vaccination 10/02/2019, 10/23/2019, 07/11/2020   PNEUMOCOCCAL CONJUGATE-20 05/12/2022   Pfizer(Comirnaty)Fall Seasonal Vaccine 12 years and older 06/16/2022   Pneumococcal Conjugate-13 07/01/2015   Pneumococcal Polysaccharide-23 06/20/2017   Tdap 01/24/2012    TDAP status: Due, Education has been provided regarding the importance of this vaccine. Advised may receive this vaccine at local pharmacy or Health Dept. Aware to provide a copy of the vaccination record if obtained from local pharmacy or Health Dept. Verbalized acceptance and understanding.  Flu Vaccine status: Completed at today's visit  Pneumococcal vaccine status: Up to date  Covid-19 vaccine status: Information provided on how to obtain vaccines.   Qualifies for Shingles Vaccine? Yes   Zostavax  completed No   Shingrix Completed?: No.    Education has been provided regarding the importance of this vaccine. Patient has been advised to call insurance company to determine out of pocket expense if they have not yet received this vaccine. Advised may also receive vaccine at local pharmacy or Health Dept. Verbalized acceptance and understanding.  Screening Tests Health Maintenance  Topic Date Due   DTaP/Tdap/Td (3 - Td or Tdap) 01/23/2022   MAMMOGRAM  06/09/2023   Medicare Annual Wellness (AWV)  05/21/2025   Pneumococcal Vaccine: 50+ Years  Completed   Influenza Vaccine  Completed   DEXA SCAN  Completed   HPV VACCINES  Aged Out   Meningococcal B Vaccine  Aged Out   COVID-19 Vaccine  Discontinued   Zoster Vaccines- Shingrix  Discontinued    Health Maintenance  Health Maintenance Due  Topic Date Due   DTaP/Tdap/Td (3 - Td or Tdap) 01/23/2022   MAMMOGRAM  06/09/2023    Colorectal cancer screening: No longer required.   Mammogram status: Ordered 05/21/2024. Pt provided with contact info and advised to call to schedule appt.   Bone Density status: Ordered 05/21/2024. Pt provided with contact info and advised to call to schedule appt.  Lung Cancer Screening: (Low Dose CT Chest recommended if Age 88-80 years, 20 pack-year currently smoking OR have quit w/in 15years.) does not qualify.   Additional Screening:  Hepatitis C Screening: does not qualify; Completed   Vision Screening: Recommended annual ophthalmology exams for early detection of glaucoma and other disorders of the eye. Is the patient up to date with their annual eye exam?  Yes  Who is the provider or what is the name of the office in which the patient attends annual eye exams? [facility] If pt is not established with a provider, would they like to be referred to a provider to establish care? No .  Dental Screening: Recommended annual dental exams for proper oral hygiene  Community Resource Referral /  Chronic Care Management: CRR required this visit?  No   CCM required this visit?  No     Plan:     I have personally reviewed and noted the following in the patient's chart:   Medical and social history Use of alcohol, tobacco or illicit drugs  Current medications and supplements including opioid prescriptions. Patient is not currently taking opioid prescriptions. Functional ability and status Nutritional status Physical activity Advanced directives List of other physicians Hospitalizations, surgeries, and ER visits in previous 12 months Vitals Screenings to include cognitive, depression, and falls Referrals and appointments  In addition, I have reviewed and discussed with patient certain preventive protocols, quality metrics, and best practice recommendations. A written personalized care plan for preventive services as well as general preventive health recommendations were provided to patient.     Veronica Mcgrath, CMA   05/21/2024   After Visit Summary: (In Person-Printed) AVS printed and given to the patient  I, Ronal JINNY Hailstone, MD, have reviewed all documentation for this visit. The documentation on 05/21/2024 for the exam, diagnosis, procedures, and orders are all accurate and complete.   I have reviewed and agree with the above Annual Wellness Visit documentation.  Ronal Norleen Hailstone, MD. Internal Medicine 05/21/2024

## 2024-05-21 NOTE — Progress Notes (Signed)
 Annual Wellness Visit   Patient Care Team: Brinnley Lacap, Ronal PARAS, MD as PCP - General (Internal Medicine) Veronica Rigg, MD as Consulting Physician (Dermatology)  Visit Date: 05/21/24   Chief Complaint  Patient presents with   Medicare Wellness   Subjective:  Patient: Veronica Mcgrath, Female DOB: 1934-11-27, 89 y.o. MRN: [id number] Vitals:   05/21/24 0957  BP: 120/80   Veronica Mcgrath is a 89 y.o. Female who presents today for her Annual Wellness Visit. Patient has Hyperlipidemia; Essential hypertension; Allergic rhinitis; Glaucoma; Eye disorder; Acute gastrointestinal hemorrhage; External bleeding hemorrhoids; Family history of malignant neoplasm of gastrointestinal tract; Hematochezia; History of colonic polyps; Left lower quadrant pain; Slow transit constipation; and Concussion on their problem list.   History of Hyperlipidemia treated with rosuvastatin  5 mg daily.     History of Hypertensin treated with Hydrochlorothiazide  25 mg daily and  benazepril  20 mg daily. Blood pressure today is normotensive at 120/80.   History of perirectal irritation with minor hemorrhoids treated with Anusol  cream.  Dr. Rosalie is her GI physician.  She saw him in November 2021 for some hemorrhoidal tags and left lower quadrant pain.  He felt she had bleeding external hemorrhoids.  She did have colonoscopy in 2011 showing a hyperplastic polyp.  CT of the abdomen and pelvis showed no intra-abdominal or pelvic pathology.  She had sigmoid diverticulosis and a bilateral ovarian cyst.  She had a indeterminate right liver lesion noted which required an MRI of the liver to determine that this was actually 3 small cavernous hemangiomas in the liver.  2022 Cologuard order has expired.  MiraLAX was recommended for constipation by Dr. Rosalie.   She had cataract extractions both eyes around 2008.  Lumbar spine surgery in 2010.   History of age-related macular degeneration followed at [facility] ophthalmology.  Glaucoma  is treated by Dr. McCuen.  She had laser treatment for glaucoma by Dr. Jarold.  Dr. Jarold thought she might have thyroid  eye disease.  I referred her to Dr. Kassie and he did not think she had thyroid  disease.   In 2021, she was referred for sleep testing because she felt she had sleep apnea. I do not think she ever actually had that study.  She has bilateral hearing loss.  Mammogram 05/08/22  IMPRESSION: No mammographic evidence of malignancy. A result letter of this screening mammogram will be mailed directly to the patient.   RECOMMENDATION: Screening mammogram in one year. (Code:[id number])   BI-RADS CATEGORY  1: Negative.   Vaccine counseling: Covid-19 vaccine ordered, received flu vaccine today    Labs 05/20/2024 CHOL 219  LDL 111   RBC 5.34 Hemoglobin 16.5 HCT 50.8  Otherwise WNL   Health Maintenance  Topic Date Due   DTaP/Tdap/Td (3 - Td or Tdap) 01/23/2022   MAMMOGRAM  06/09/2023   Medicare Annual Wellness (AWV)  05/21/2025   Pneumococcal Vaccine: 50+ Years  Completed   Influenza Vaccine  Completed   DEXA SCAN  Completed   HPV VACCINES  Aged Out   Meningococcal B Vaccine  Aged Out   COVID-19 Vaccine  Discontinued   Zoster Vaccines- Shingrix  Discontinued       Objective:  Vitals: body mass index is 20.07 kg/m. Today's Vitals   05/21/24 0957  BP: 120/80  Pulse: 72  SpO2: 98%  Weight: 132 lb (59.9 kg)  Height: 5' 8 (1.727 m)  PainSc: 0-No pain   Physical Exam Cardiovascular:     Rate and Rhythm: Normal rate and regular  rhythm.     Heart sounds: No murmur heard. Pulmonary:     Breath sounds: Normal breath sounds.  Musculoskeletal:     Right lower leg: No edema.     Left lower leg: No edema.     Current Outpatient Medications  Medication Instructions   benazepril  (LOTENSIN ) 20 mg, Oral, Daily   COVID-19 mRNA vaccine, Moderna, >/= 21yrs, (SPIKEVAX) injection 0.5 mLs, Intramuscular,  Once   Past Medical History:  Diagnosis Date   Glaucoma     Hyperlipidemia    Hypertension    Medical/Surgical History Narrative:  Allergic/Intolerant to: No Known Allergies  Past Surgical History:  Procedure Laterality Date   LUMBAR DISC SURGERY  2010   Family History  Problem Relation Age of Onset   Diabetes Mother    Stroke Mother    Cancer Father    Thyroid  disease Neg Hx    Breast cancer Neg Hx    Family History Narrative: Father died of colon cancer at age 32.  Mother died of stroke with history of diabetes at age 18.  1 sister in good health.  3 adult sons.    Social history Narrative:  She is retired. Social alcohol consumption consisting of wine but does not consume it daily. She has a Education officer, community.   Most Recent Health Risks Assessment:   Medicare Risk at Home - 05/21/24 1004     Any stairs in or around the home? Yes    If so, are there any without handrails? No    Home free of loose throw rugs in walkways, pet beds, electrical cords, etc? No    Adequate lighting in your home to reduce risk of falls? No    Life alert? No    Use of a cane, walker or w/c? No    Grab bars in the bathroom? Yes    Shower chair or bench in shower? No    Elevated toilet seat or a handicapped toilet? No         Most Recent Social Determinants of Health (Including Hx of Tobacco, Alcohol, and Drug Use) SDOH Screenings   Food Insecurity: No Food Insecurity (05/21/2024)  Housing: Low Risk  (05/21/2024)  Transportation Needs: No Transportation Needs (05/21/2024)  Utilities: Not At Risk (05/21/2024)  Alcohol Screen: Low Risk  (05/21/2024)  Depression (PHQ2-9): Low Risk  (05/21/2024)  Financial Resource Strain: Low Risk  (05/21/2024)  Physical Activity: Insufficiently Active (05/21/2024)  Social Connections: Moderately Isolated (05/21/2024)  Stress: No Stress Concern Present (05/21/2024)  Tobacco Use: Low Risk  (05/21/2024)  Health Literacy: Adequate Health Literacy (05/21/2024)   Social History   Tobacco Use   Smoking status: Never   Smokeless  tobacco: Never   Most Recent Functional Status Assessment:    05/21/2024   10:04 AM  In your present state of health, do you have any difficulty performing the following activities:  Hearing? 1  Vision? 0  Difficulty concentrating or making decisions? 0  Walking or climbing stairs? 0  Dressing or bathing? 0  Doing errands, shopping? 0  Preparing Food and eating ? N  Using the Toilet? N  In the past six months, have you accidently leaked urine? N  Do you have problems with loss of bowel control? N  Managing your Medications? N  Managing your Finances? N  Housekeeping or managing your Housekeeping? N   Most Recent Fall Risk Assessment:    05/18/2023   10:03 AM  Fall Risk   Falls in the past  year? 0  Number falls in past yr: 0  Injury with Fall? 0  Risk for fall due to : No Fall Risks  Follow up Falls evaluation completed   Most Recent Anxiety/Depression Screenings:    05/21/2024   10:23 AM 05/18/2023   10:03 AM  PHQ 2/9 Scores  PHQ - 2 Score 0 0    Most Recent Cognitive Screening:    05/21/2024   10:05 AM  6CIT Screen  What Year? 0 points  What month? 0 points  What time? 0 points  Count back from 20 0 points  Months in reverse 0 points  Repeat phrase 0 points  Total Score 0 points    Results:  Studies Obtained And Personally Reviewed By Me:    Mammogram 05/08/22  IMPRESSION: No mammographic evidence of malignancy. A result letter of this screening mammogram will be mailed directly to the patient.   RECOMMENDATION: Screening mammogram in one year. (Code:[id number])   BI-RADS CATEGORY  1: Negative.   Labs:  CBC w/ Differential Lab Results  Component Value Date   WBC 8.2 05/20/2024   RBC 5.34 (H) 05/20/2024   HGB 16.5 (H) 05/20/2024   HCT 50.8 (H) 05/20/2024   PLT 273 05/20/2024   MCV 95.1 05/20/2024   MCH 30.9 05/20/2024   MCHC 32.5 05/20/2024   RDW 12.5 05/20/2024   MPV 10.2 05/20/2024   LYMPHSABS 1,668 05/26/2023   MONOABS 689 04/14/2017    BASOSABS 57 05/20/2024    Comprehensive Metabolic Panel Lab Results  Component Value Date   NA 139 05/20/2024   K 5.2 05/20/2024   CL 102 05/20/2024   CO2 30 05/20/2024   GLUCOSE 84 05/20/2024   BUN 14 05/20/2024   CREATININE 0.70 05/20/2024   CALCIUM  10.0 05/20/2024   PROT 7.0 05/20/2024   ALBUMIN 4.2 04/14/2017   AST 14 05/20/2024   ALT 12 05/20/2024   ALKPHOS 45 04/14/2017   BILITOT 0.8 05/20/2024   EGFR 72 05/26/2023   GFRNONAA 63 04/24/2020   Lipid Panel  Lab Results  Component Value Date   CHOL 219 (H) 05/20/2024   HDL 92 05/20/2024   LDLCALC 111 (H) 05/20/2024   TRIG 73 05/20/2024   A1c No results found for: HGBA1C  TSH Lab Results  Component Value Date   TSH 2.45 05/20/2024     Assessment & Plan:   Orders Placed This Encounter  Procedures   Flu vaccine HIGH DOSE PF(Fluzone Trivalent)   Meds ordered this encounter  Medications   COVID-19 mRNA vaccine, Moderna, >/= 34yrs, (SPIKEVAX) injection    Sig: Inject 0.5 mLs into the muscle once for 1 dose.    Dispense:  0.5 mL    Refill:  0   Hyperlipidemia: Treated with rosuvastatin  5 mg daily.     Hypertension: Treated with Hydrochlorothiazide  25 mg daily and benazepril  20 mg daily. Blood pressure today is normotensive at 120/80.    Age-related macular degeneration: Followed at [facility] ophthalmology.    Glaucoma: Management per Dr. McCuen. She had laser treatment by Dr. Jarold.   Mammogram 05/08/22  IMPRESSION: No mammographic evidence of malignancy. A result letter of this screening mammogram will be mailed directly to the patient.   RECOMMENDATION: Screening mammogram in one year.    BI-RADS CATEGORY  1: Negative.    Vaccine counseling: Covid-19 vaccine ordered, received flu vaccine today.     Return in 1 year or as needed.   No follow-ups on file.   Annual Wellness Visit done today  including the all of the following: Reviewed patient's Family Medical History Reviewed patient's SDOH  and reviewed tobacco, alcohol, and drug use.  Reviewed and updated list of patient's medical providers Assessment of cognitive impairment was done Assessed patient's functional ability Established a written schedule for health screening services Health Risk Assessent Completed and Reviewed  Discussed health benefits of physical activity, and encouraged her to engage in regular exercise appropriate for her age and condition.    I,Makayla C Reid,acting as a scribe for Ronal JINNY Hailstone, MD.,have documented all relevant documentation on the behalf of Ronal JINNY Hailstone, MD,as directed by  Ronal JINNY Hailstone, MD while in the presence of Ronal JINNY Hailstone, MD.   I, Ronal JINNY Hailstone, MD, have reviewed all documentation for and agree with the above Annual Wellness Visit documentation.  Ronal JINNY Hailstone, MD Internal Medicine 05/21/2024

## 2024-06-01 DIAGNOSIS — Z23 Encounter for immunization: Secondary | ICD-10-CM | POA: Diagnosis not present

## 2024-06-13 NOTE — Progress Notes (Signed)
 Annual Wellness Visit   Patient Care Team: Tallia Moehring, Ronal PARAS, MD as PCP - General (Internal Medicine) Livingston Rigg, MD as Consulting Physician (Dermatology)  Visit Date: 06/14/24   Chief Complaint  Patient presents with   Annual Exam   Subjective:  Patient: Veronica Mcgrath, Female DOB: Jun 04, 1935, 89 y.o. MRN: [id number] Vitals:   06/14/24 1357 06/14/24 1409  BP: (!) 150/90 (!) 150/90   Veronica Mcgrath is a 89 y.o. Female who presents today for her Annual Wellness Visit. Patient has Hyperlipidemia; Essential hypertension; Allergic rhinitis; Glaucoma; Eye disorder; Acute gastrointestinal hemorrhage; External bleeding hemorrhoids; Family history of malignant neoplasm of gastrointestinal tract; Hematochezia; History of colonic polyps; Left lower quadrant pain; Slow transit constipation; and Concussion on their problem list.  She says she keeps busy by doing a lot of home projects, cooking, gardening.   History of Hyperlipidemia treated with rosuvastatin  5 mg daily. 05/20/2024 Lipid panel CHOl 219, LDL 111, Otherwise WNL.   History of Hypertension treated with Hydrochlorothiazide  25 mg daily and  benazepril  20 mg daily. Blood pressure today is hypertensive at 150/90.    History of perirectal irritation with hemorrhoids treated with Anusol  cream.  Dr. Rosalie is her Gastroenterologist.  She saw him in November 2021 for some hemorrhoidal tags and left lower quadrant pain.  He felt she had bleeding external hemorrhoids.  She did have colonoscopy in 2011 showing a hyperplastic polyp.  CT of the abdomen and pelvis showed no intra-abdominal or pelvic pathology.  She had sigmoid diverticulosis and a bilateral ovarian cyst.  She had a indeterminate right liver lesion noted which required an MRI of the liver to determine that this was actually 3 small cavernous hemangiomas in the liver.   MiraLAX was recommended for constipation by Dr. Rosalie.   She had cataract extractions both eyes around 2008.   Lumbar spine surgery in 2010.   History of age-related Macular degeneration followed at [facility].  Glaucoma is treated by Dr. McCuen.  She had laser treatment for glaucoma by Dr. Jarold.  Dr. Jarold thought she might have thyroid  eye disease.  I referred her to Dr. Kassie and he did not think she had thyroid  disease.   In 2021, she was referred for sleep testing because she felt she had sleep apnea. I do not think she ever actually had that study.    She has bilateral hearing loss.   06/08/22 Mammogram No mammographic evidence of malignancy. Repeat in one year.   Labs 05/20/2024 RBC 5.34, Hemoglobin 16.5, HCT 50.8, CHOL 219, LDL 111   Vaccine counseling: Tetanus vaccine due, UTD on Influenza and Covid-19 vaccines.   Health Maintenance  Topic Date Due   DTaP/Tdap/Td (3 - Td or Tdap) 01/23/2022   Mammogram  06/09/2023   Medicare Annual Wellness (AWV)  05/21/2025   Pneumococcal Vaccine: 50+ Years  Completed   Influenza Vaccine  Completed   DEXA SCAN  Completed   HPV VACCINES  Aged Out   Meningococcal B Vaccine  Aged Out   COVID-19 Vaccine  Discontinued   Zoster Vaccines- Shingrix  Discontinued   Review of Systems  Constitutional:  Negative for fever and malaise/fatigue.  HENT:  Negative for congestion.   Eyes:  Negative for blurred vision.  Respiratory:  Negative for cough and shortness of breath.   Cardiovascular:  Negative for chest pain, palpitations and leg swelling.  Gastrointestinal:  Negative for vomiting.  Musculoskeletal:  Negative for back pain.  Skin:  Negative for rash.  Neurological:  Negative for loss of consciousness and headaches.   Objective:  Vitals: body mass index is 20.22 kg/m. Today's Vitals   06/14/24 1357 06/14/24 1409  BP: (!) 150/90 (!) 150/90  Pulse: 88   SpO2: 92%   Weight: 133 lb (60.3 kg)   Height: 5' 8 (1.727 m)    Physical Exam Vitals and nursing note reviewed.  Constitutional:      General: She is not in acute  distress.    Appearance: Normal appearance. She is not ill-appearing or toxic-appearing.  HENT:     Head: Normocephalic and atraumatic.     Right Ear: Hearing, tympanic membrane, ear canal and external ear normal.     Left Ear: Hearing, tympanic membrane, ear canal and external ear normal.     Mouth/Throat:     Pharynx: Oropharynx is clear.  Eyes:     Extraocular Movements: Extraocular movements intact.     Pupils: Pupils are equal, round, and reactive to light.  Neck:     Thyroid : No thyroid  mass, thyromegaly or thyroid  tenderness.     Vascular: No carotid bruit.  Cardiovascular:     Rate and Rhythm: Normal rate and regular rhythm. No extrasystoles are present.    Pulses:          Dorsalis pedis pulses are 2+ on the right side and 2+ on the left side.       Posterior tibial pulses are 2+ on the right side and 2+ on the left side.     Heart sounds: Normal heart sounds. No murmur heard.    No friction rub. No gallop.  Pulmonary:     Effort: Pulmonary effort is normal.     Breath sounds: Normal breath sounds. No decreased breath sounds, wheezing, rhonchi or rales.  Chest:     Chest wall: No mass.     Comments: Benign red nevi Abdominal:     Palpations: Abdomen is soft. There is no hepatomegaly, splenomegaly or mass.     Tenderness: There is no abdominal tenderness.     Hernia: No hernia is present.  Genitourinary:    Comments: Pelvic exam declined.  Musculoskeletal:     Cervical back: Normal range of motion.     Right lower leg: No edema.     Left lower leg: No edema.  Lymphadenopathy:     Cervical: No cervical adenopathy.     Upper Body:     Right upper body: No supraclavicular adenopathy.     Left upper body: No supraclavicular adenopathy.  Skin:    General: Skin is warm and dry.  Neurological:     General: No focal deficit present.     Mental Status: She is alert and oriented to person, place, and time. Mental status is at baseline.     Sensory: Sensation is intact.      Motor: Motor function is intact. No weakness.     Deep Tendon Reflexes: Reflexes are normal and symmetric.  Psychiatric:        Attention and Perception: Attention normal.        Mood and Affect: Mood normal.        Speech: Speech normal.        Behavior: Behavior normal.        Thought Content: Thought content normal.        Cognition and Memory: Cognition normal.        Judgment: Judgment normal.     Current Outpatient Medications  Medication Instructions   benazepril  (  LOTENSIN ) 20 mg, Oral, Daily   Past Medical History:  Diagnosis Date   Glaucoma    Hyperlipidemia    Hypertension    Past Surgical History:  Procedure Laterality Date   LUMBAR DISC SURGERY  2010   Family History  Problem Relation Age of Onset   Diabetes Mother    Stroke Mother    Cancer Father    Thyroid  disease Neg Hx    Breast cancer Neg Hx    Family History Narrative: Father died of colon cancer at age 47. Mother died of stroke with history of diabetes at age 62. 1 sister in good health. 3 adult sons.  Social history Narrative:  She is retired. Social alcohol consumption consisting of wine but does not consume it daily. She has a Education officer, community.     Most Recent Health Risks Assessment:   Most Recent Social Determinants of Health (Including Hx of Tobacco, Alcohol, and Drug Use) SDOH Screenings   Food Insecurity: No Food Insecurity (06/13/2024)  Housing: Low Risk  (06/13/2024)  Transportation Needs: No Transportation Needs (06/13/2024)  Utilities: Not At Risk (05/21/2024)  Alcohol Screen: Low Risk  (06/13/2024)  Depression (PHQ2-9): Low Risk  (05/21/2024)  Financial Resource Strain: Low Risk  (06/13/2024)  Physical Activity: Unknown (06/13/2024)  Recent Concern: Physical Activity - Insufficiently Active (05/21/2024)  Social Connections: Unknown (06/13/2024)  Recent Concern: Social Connections - Moderately Isolated (05/21/2024)  Stress: No Stress Concern Present (06/13/2024)  Tobacco Use: Low Risk   (06/14/2024)  Health Literacy: Adequate Health Literacy (05/21/2024)   Social History   Tobacco Use   Smoking status: Never   Smokeless tobacco: Never   Most Recent Functional Status Assessment:    05/21/2024   10:04 AM  In your present state of health, do you have any difficulty performing the following activities:  Hearing? 1  Vision? 0  Difficulty concentrating or making decisions? 0  Walking or climbing stairs? 0  Dressing or bathing? 0  Doing errands, shopping? 0  Preparing Food and eating ? N  Using the Toilet? N  In the past six months, have you accidently leaked urine? N  Do you have problems with loss of bowel control? N  Managing your Medications? N  Managing your Finances? N  Housekeeping or managing your Housekeeping? N   Most Recent Fall Risk Assessment:    05/18/2023   10:03 AM  Fall Risk   Falls in the past year? 0  Number falls in past yr: 0  Injury with Fall? 0  Risk for fall due to : No Fall Risks  Follow up Falls evaluation completed   Most Recent Anxiety/Depression Screenings:    05/21/2024   10:23 AM 05/18/2023   10:03 AM  PHQ 2/9 Scores  PHQ - 2 Score 0 0    Most Recent Cognitive Screening:    05/21/2024   10:05 AM  6CIT Screen  What Year? 0 points  What month? 0 points  What time? 0 points  Count back from 20 0 points  Months in reverse 0 points  Repeat phrase 0 points  Total Score 0 points    Results:  Studies Obtained And Personally Reviewed By Me:   06/08/22 Mammogram No mammographic evidence of malignancy. Repeat in one year.   Labs:  CBC w/ Differential Lab Results  Component Value Date   WBC 8.2 05/20/2024   RBC 5.34 (H) 05/20/2024   HGB 16.5 (H) 05/20/2024   HCT 50.8 (H) 05/20/2024   PLT 273 05/20/2024  MCV 95.1 05/20/2024   MCH 30.9 05/20/2024   MCHC 32.5 05/20/2024   RDW 12.5 05/20/2024   MPV 10.2 05/20/2024   LYMPHSABS 1,668 05/26/2023   MONOABS 689 04/14/2017   BASOSABS 57 05/20/2024    Comprehensive Metabolic  Panel Lab Results  Component Value Date   NA 139 05/20/2024   K 5.2 05/20/2024   CL 102 05/20/2024   CO2 30 05/20/2024   GLUCOSE 84 05/20/2024   BUN 14 05/20/2024   CREATININE 0.70 05/20/2024   CALCIUM  10.0 05/20/2024   PROT 7.0 05/20/2024   ALBUMIN 4.2 04/14/2017   AST 14 05/20/2024   ALT 12 05/20/2024   ALKPHOS 45 04/14/2017   BILITOT 0.8 05/20/2024   EGFR 72 05/26/2023   GFRNONAA 63 04/24/2020   Lipid Panel  Lab Results  Component Value Date   CHOL 219 (H) 05/20/2024   HDL 92 05/20/2024   LDLCALC 111 (H) 05/20/2024   TRIG 73 05/20/2024    TSH Lab Results  Component Value Date   TSH 2.45 05/20/2024    Assessment & Plan:   Orders Placed This Encounter  Procedures   MM 3D SCREENING MAMMOGRAM BILATERAL BREAST    Standing Status:   Future    Expiration Date:   06/14/2025    Reason for Exam (SYMPTOM  OR DIAGNOSIS REQUIRED):   screening for breast cancer    Preferred [facility]   POCT URINALYSIS DIP (CLINITEK)   Hyperlipidemia: treated with rosuvastatin  5 mg daily. 05/20/2024 Lipid panel CHOl 219, LDL 111, Otherwise WNL.   Hypertension: treated  benazepril  20 mg daily. Blood pressure today is hypertensive at 150/90. Has not been taking hydrochlorothiazide .   History of perirectal irritation with hemorrhoids treated with Anusol  cream.  Dr. Rosalie is her Gastroenterologist.  She saw him in November 2021 for some hemorrhoidal tags and left lower quadrant pain.  He felt she had bleeding external hemorrhoids.  She did have colonoscopy in 2011 showing a hyperplastic polyp.  CT of the abdomen and pelvis showed no intra-abdominal or pelvic pathology.  She had sigmoid diverticulosis and a bilateral ovarian cyst.  She had a indeterminate right liver lesion noted which required an MRI of the liver to determine that this was actually 3 small cavernous hemangiomas in the liver.   MiraLAX was recommended for constipation by Dr. Rosalie.   Age-related Macular  degeneration: followed at [facility].    Glaucoma is treated by Dr. McCuen.  She had laser treatment for glaucoma by Dr. Jarold.  Dr. Jarold thought she might have thyroid  eye disease.  I referred her to Dr. Kassie and he did not think she had thyroid  disease.  Hx EMR seen at [facility].    She has bilateral hearing loss.  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 Repeat in one year.    Mammogram ordered.   Vaccine counseling: Tetanus vaccine due, UTD on Influenza and Covid-19 vaccines.      Annual Wellness Visit done today including the all of the following: Reviewed patient's Family Medical History Reviewed patient's SDOH and reviewed tobacco, alcohol, and drug use.  Reviewed and updated list of patient's medical providers Assessment of cognitive impairment was done Assessed patient's functional ability Established a written schedule for health screening services Health Risk Assessent Completed and Reviewed  Discussed health benefits  of physical activity, and encouraged her to engage in regular exercise appropriate for her age and condition.    I,Makayla C Reid,acting as a scribe for Ronal JINNY Hailstone, MD.,have documented all relevant documentation on the behalf of Ronal JINNY Hailstone, MD,as directed by  Ronal JINNY Hailstone, MD while in the presence of Ronal JINNY Hailstone, MD.   I, Ronal JINNY Hailstone, MD, have reviewed all documentation for and agree with the above Annual Wellness Visit documentation.  Ronal JINNY Hailstone, MD Internal Medicine 06/14/2024

## 2024-06-14 ENCOUNTER — Encounter: Payer: Self-pay | Admitting: Internal Medicine

## 2024-06-14 ENCOUNTER — Ambulatory Visit (INDEPENDENT_AMBULATORY_CARE_PROVIDER_SITE_OTHER): Admitting: Internal Medicine

## 2024-06-14 VITALS — BP 140/90 | HR 88 | Ht 68.0 in | Wt 133.0 lb

## 2024-06-14 DIAGNOSIS — Z0000 Encounter for general adult medical examination without abnormal findings: Secondary | ICD-10-CM

## 2024-06-14 DIAGNOSIS — Z800 Family history of malignant neoplasm of digestive organs: Secondary | ICD-10-CM | POA: Diagnosis not present

## 2024-06-14 DIAGNOSIS — Z1231 Encounter for screening mammogram for malignant neoplasm of breast: Secondary | ICD-10-CM

## 2024-06-14 DIAGNOSIS — H903 Sensorineural hearing loss, bilateral: Secondary | ICD-10-CM | POA: Diagnosis not present

## 2024-06-14 DIAGNOSIS — Z8719 Personal history of other diseases of the digestive system: Secondary | ICD-10-CM | POA: Diagnosis not present

## 2024-06-14 DIAGNOSIS — K644 Residual hemorrhoidal skin tags: Secondary | ICD-10-CM

## 2024-06-14 DIAGNOSIS — E7800 Pure hypercholesterolemia, unspecified: Secondary | ICD-10-CM | POA: Diagnosis not present

## 2024-06-14 DIAGNOSIS — H35373 Puckering of macula, bilateral: Secondary | ICD-10-CM | POA: Diagnosis not present

## 2024-06-14 DIAGNOSIS — I6381 Other cerebral infarction due to occlusion or stenosis of small artery: Secondary | ICD-10-CM | POA: Diagnosis not present

## 2024-06-14 DIAGNOSIS — I10 Essential (primary) hypertension: Secondary | ICD-10-CM

## 2024-06-14 DIAGNOSIS — H3530 Unspecified macular degeneration: Secondary | ICD-10-CM | POA: Diagnosis not present

## 2024-06-14 LAB — POCT URINALYSIS DIP (CLINITEK)
Bilirubin, UA: NEGATIVE
Blood, UA: NEGATIVE
Glucose, UA: NEGATIVE mg/dL
Ketones, POC UA: NEGATIVE mg/dL
Leukocytes, UA: NEGATIVE
Nitrite, UA: NEGATIVE
POC PROTEIN,UA: NEGATIVE
Spec Grav, UA: 1.015 (ref 1.010–1.025)
Urobilinogen, UA: 0.2 U/dL
pH, UA: 6.5 (ref 5.0–8.0)

## 2024-06-24 ENCOUNTER — Other Ambulatory Visit (HOSPITAL_COMMUNITY): Payer: Self-pay

## 2024-06-29 ENCOUNTER — Encounter: Payer: Self-pay | Admitting: Internal Medicine

## 2024-06-29 NOTE — Patient Instructions (Addendum)
 It was a pleasure to see you today. Continue to monitor blood pressure at home. It is slightly elevated today. Let me know if this is persistent. Continue rosuvastatin  for cholesterol. Vaccines discussed. Continue follow up with Dr. McCuen for your eye issues.  Your Blood pressure is elevated today and this is frowned upon by medicare. Please return for BP check when you have taken both blood pressure meds 2 hours in advance of appointment.

## 2024-07-03 ENCOUNTER — Ambulatory Visit: Admitting: Internal Medicine

## 2024-07-08 NOTE — Progress Notes (Signed)
 Patient Care Team: Perri Ronal PARAS, MD as PCP - General (Internal Medicine) Livingston Rigg, MD as Consulting Physician (Dermatology)  Visit Date: 07/11/24  Subjective:    Patient ID: Veronica Mcgrath , Female   DOB: March 21, 1935, 89 y.o.    MRN: [id number]   89 y.o. Female presents today for follow up on elevated blood pressure readings.  Patient has a past medical history of Hyperlipidemia, Allergic rhinitis, Glaucoma. She has a hx of hypertension treated with Benezepril 20 mg daily.   Blood pressure today is elevated at 140/90. She says that she takes her medication at night and when she checks her blood pressure in the morning the reading is usually lower. She was advised to take antihypertensive med first thing in the morning instead.  Today on recheck her blood pressure was elevated at 160/100 and about a couple minutes later it was 140/90. Says she checks her blood pressure at home but she doesn't do it regularly.    Past Medical History:  Diagnosis Date   Glaucoma    Hyperlipidemia    Hypertension      Family History  Problem Relation Age of Onset   Diabetes Mother    Stroke Mother    Cancer Father    Thyroid  disease Neg Hx    Breast cancer Neg Hx    Family History Narrative: Father died of colon cancer at age 76.  Mother died of stroke with history of diabetes at age 38.  1 sister in good health.  3 adult sons.    Social history Narrative:  She is retired. Social alcohol consumption consisting of wine but does not consume it daily. She has a education officer, community.    Review of Systems  All other systems reviewed and are negative.       Objective:   Vitals: BP (!) 130/90   Pulse 90   Ht 5' 8 (1.727 m)   Wt 134 lb (60.8 kg)   SpO2 95%   BMI 20.37 kg/m    Physical Exam Vitals and nursing note reviewed.     Chest is clear to Auscultation. Cor: RRR without murmur or ectopy. No LE edema.  Results:     Labs:       Component Value Date/Time   NA  139 05/20/2024 0910   K 5.2 05/20/2024 0910   CL 102 05/20/2024 0910   CO2 30 05/20/2024 0910   GLUCOSE 84 05/20/2024 0910   BUN 14 05/20/2024 0910   CREATININE 0.70 05/20/2024 0910   CALCIUM  10.0 05/20/2024 0910   PROT 7.0 05/20/2024 0910   ALBUMIN 4.2 04/14/2017 0929   AST 14 05/20/2024 0910   ALT 12 05/20/2024 0910   ALKPHOS 45 04/14/2017 0929   BILITOT 0.8 05/20/2024 0910   GFRNONAA 63 04/24/2020 1128   GFRAA 73 04/24/2020 1128     Lab Results  Component Value Date   WBC 8.2 05/20/2024   HGB 16.5 (H) 05/20/2024   HCT 50.8 (H) 05/20/2024   MCV 95.1 05/20/2024   PLT 273 05/20/2024    Lab Results  Component Value Date   CHOL 219 (H) 05/20/2024   HDL 92 05/20/2024   LDLCALC 111 (H) 05/20/2024   TRIG 73 05/20/2024   CHOLHDL 2.4 05/20/2024     Lab Results  Component Value Date   TSH 2.45 05/20/2024         Assessment & Plan:   Hypertension: treated with Benazepril  20 mg daily. Blood pressure today is  elevated at 140/90. She says that she takes her medication at night and when she checks her blood pressure in the morning it is usually lower. She was advised to take it in the morning instead. On recheck her blood pressure was hypertensive at  160/100 and about a couple minutes later it was 140/90. Says she checks her blood pressure at home but she doesn't do it regularly.    Recommended to take blood pressure medication in the morning and measure blood pressure twice a day for a week and call the office with the readings.    I,Makayla C Reid,acting as a scribe for Ronal JINNY Hailstone, MD.,have documented all relevant documentation on the behalf of Ronal JINNY Hailstone, MD,as directed by  Ronal JINNY Hailstone, MD while in the presence of Ronal JINNY Hailstone, MD.

## 2024-07-11 ENCOUNTER — Ambulatory Visit (INDEPENDENT_AMBULATORY_CARE_PROVIDER_SITE_OTHER): Admitting: Internal Medicine

## 2024-07-11 ENCOUNTER — Encounter: Payer: Self-pay | Admitting: Internal Medicine

## 2024-07-11 VITALS — BP 130/90 | HR 90 | Ht 68.0 in | Wt 134.0 lb

## 2024-07-11 DIAGNOSIS — I10 Essential (primary) hypertension: Secondary | ICD-10-CM | POA: Diagnosis not present

## 2024-07-11 NOTE — Patient Instructions (Signed)
 Please take Benazepril  20 mg daily in the morning as soon as you arise.  Please check your blood pressure twice daily for a week and send us  those blood pressure readings.  We will advise further about your blood pressure management at that time.

## 2024-07-17 ENCOUNTER — Other Ambulatory Visit: Payer: Self-pay

## 2024-07-17 MED ORDER — BENAZEPRIL HCL 20 MG PO TABS: 20.0000 mg | ORAL_TABLET | Freq: Every day | ORAL | 1 refills | Status: AC

## 2024-07-18 ENCOUNTER — Telehealth: Payer: Self-pay | Admitting: Internal Medicine

## 2024-08-12 DIAGNOSIS — D3131 Benign neoplasm of right choroid: Secondary | ICD-10-CM | POA: Diagnosis not present

## 2024-08-12 DIAGNOSIS — H1789 Other corneal scars and opacities: Secondary | ICD-10-CM | POA: Diagnosis not present

## 2024-08-12 DIAGNOSIS — H353133 Nonexudative age-related macular degeneration, bilateral, advanced atrophic without subfoveal involvement: Secondary | ICD-10-CM | POA: Diagnosis not present

## 2024-08-12 DIAGNOSIS — H401122 Primary open-angle glaucoma, left eye, moderate stage: Secondary | ICD-10-CM | POA: Diagnosis not present

## 2024-08-12 DIAGNOSIS — H401111 Primary open-angle glaucoma, right eye, mild stage: Secondary | ICD-10-CM | POA: Diagnosis not present

## 2024-08-12 DIAGNOSIS — H52203 Unspecified astigmatism, bilateral: Secondary | ICD-10-CM | POA: Diagnosis not present

## 2024-08-22 NOTE — Telephone Encounter (Signed)
 done

## 2024-10-11 ENCOUNTER — Telehealth: Payer: Self-pay | Admitting: Internal Medicine

## 2024-10-11 NOTE — Telephone Encounter (Signed)
 Done

## 2024-10-17 ENCOUNTER — Ambulatory Visit: Admitting: Internal Medicine

## 2024-10-17 ENCOUNTER — Encounter: Payer: Self-pay | Admitting: Internal Medicine

## 2024-10-17 VITALS — BP 150/104 | HR 100 | Ht 68.0 in | Wt 135.0 lb

## 2024-10-17 DIAGNOSIS — I10 Essential (primary) hypertension: Secondary | ICD-10-CM

## 2024-10-17 MED ORDER — HYDROCHLOROTHIAZIDE 25 MG PO TABS: 25.0000 mg | ORAL_TABLET | Freq: Every day | ORAL | 3 refills | Status: AC

## 2024-10-17 NOTE — Progress Notes (Shared)
 "   Patient Care Team: Perri Ronal PARAS, MD as PCP - General (Internal Medicine) Livingston Rigg, MD as Consulting Physician (Dermatology)  Visit Date: 10/17/24  Subjective:    Patient ID: Veronica Mcgrath , Female   DOB: 05-07-1935, 89 y.o.    MRN: [id number]   89 y.o. Female presents today for hypertension. Patient has a past medical history of Hyperlipidemia, Allergic rhinitis, Glaucoma.  History of Hypertension treated with Benazepril  20 mg daily and hydrochlorothiazide  25 mg daily. Was last seen here on 07/11/2024 for elevated blood pressure. She was told to take antihypertensive medication in the morning and take blood pressure twice and day and record the readings. Blood pressure today is elevated 150/108. She had stopped taking hydrochlorothiazide  and recently restarted taking it. She is unsure if it has had any impact on her blood pressure as she started taking it after she stopped monitoring her blood pressure. At her last visit on 07/11/2024 her blood pressure was elevated at 130/90. Denies being anxious about anything.    Past Medical History:  Diagnosis Date   Glaucoma    Hyperlipidemia    Hypertension    Family History Narrative: Father died of colon cancer at age 14.  Mother died of stroke with history of diabetes at age 63.  1 sister in good health.  3 adult sons.   Family History  Problem Relation Age of Onset   Diabetes Mother    Stroke Mother    Cancer Father    Thyroid  disease Neg Hx    Breast cancer Neg Hx      Social history Narrative:  She is retired. Social alcohol consumption consisting of wine but does not consume it daily. She has a education officer, community.     Review of Systems  All other systems reviewed and are negative.       Objective:   Vitals: BP (!) 150/108   Pulse 100   Ht 5' 8 (1.727 m)   Wt 135 lb (61.2 kg)   SpO2 96%   BMI 20.53 kg/m    Physical Exam Vitals and nursing note reviewed.  Constitutional:      General: She is not  in acute distress.    Appearance: Normal appearance. She is not toxic-appearing.  HENT:     Head: Normocephalic and atraumatic.  Cardiovascular:     Rate and Rhythm: Normal rate and regular rhythm. No extrasystoles are present.    Pulses: Normal pulses.     Heart sounds: Normal heart sounds. No murmur heard.    No friction rub. No gallop.  Pulmonary:     Effort: Pulmonary effort is normal. No respiratory distress.     Breath sounds: Normal breath sounds. No wheezing or rales.  Skin:    General: Skin is warm and dry.  Neurological:     Mental Status: She is alert and oriented to person, place, and time. Mental status is at baseline.  Psychiatric:        Mood and Affect: Mood normal.        Behavior: Behavior normal.        Thought Content: Thought content normal.        Judgment: Judgment normal.       Results:   Studies obtained and personally reviewed by me:   Labs:       Component Value Date/Time   NA 139 05/20/2024 0910   K 5.2 05/20/2024 0910   CL 102 05/20/2024 0910   CO2 30  05/20/2024 0910   GLUCOSE 84 05/20/2024 0910   BUN 14 05/20/2024 0910   CREATININE 0.70 05/20/2024 0910   CALCIUM  10.0 05/20/2024 0910   PROT 7.0 05/20/2024 0910   ALBUMIN 4.2 04/14/2017 0929   AST 14 05/20/2024 0910   ALT 12 05/20/2024 0910   ALKPHOS 45 04/14/2017 0929   BILITOT 0.8 05/20/2024 0910   GFRNONAA 63 04/24/2020 1128   GFRAA 73 04/24/2020 1128     Lab Results  Component Value Date   WBC 8.2 05/20/2024   HGB 16.5 (H) 05/20/2024   HCT 50.8 (H) 05/20/2024   MCV 95.1 05/20/2024   PLT 273 05/20/2024    Lab Results  Component Value Date   CHOL 219 (H) 05/20/2024   HDL 92 05/20/2024   LDLCALC 111 (H) 05/20/2024   TRIG 73 05/20/2024   CHOLHDL 2.4 05/20/2024    Lab Results  Component Value Date   TSH 2.45 05/20/2024      Assessment & Plan:   Meds ordered this encounter  Medications   hydrochlorothiazide  (HYDRODIURIL ) 25 MG tablet    Sig: Take 1 tablet (25 mg  total) by mouth daily.    Dispense:  90 tablet    Refill:  3   Hypertension:  treated with Benazepril  20 mg daily and hydrochlorothiazide  25 mg daily. Was last seen here on 07/11/2024 for elevated blood pressure. She was told to take antihypertensive medication in the morning and take blood pressure twice and day and record the readings. Blood pressure today is elevated 150/108. She had stopped taking hydrochlorothiazide  and recently restarted taking it. She is unsure if it has had any impact on her blood pressure as she started taking it after she stopped monitoring her blood pressure. At her last visit on 07/11/2024 her blood pressure was elevated at 130/90. Denies being anxious about anything.   Hydrochlorothiazide  25 mg daily prescribed.   I,Makayla C Reid,acting as a scribe for Ronal JINNY Hailstone, MD.,have documented all relevant documentation on the behalf of Ronal JINNY Hailstone, MD,as directed by  Ronal JINNY Hailstone, MD while in the presence of Ronal JINNY Hailstone, MD.      "

## 2024-10-31 ENCOUNTER — Ambulatory Visit: Admitting: Internal Medicine

## 2025-06-16 ENCOUNTER — Other Ambulatory Visit: Payer: Self-pay

## 2025-06-17 ENCOUNTER — Ambulatory Visit: Payer: Self-pay | Admitting: Internal Medicine
